# Patient Record
Sex: Male | Born: 1935 | Race: White | Hispanic: No | Marital: Married | Smoking: Former smoker
Health system: Southern US, Community
[De-identification: ages and names within clinical notes are randomized; demographics above are authoritative.]

## PROBLEM LIST (undated history)

## (undated) DIAGNOSIS — N400 Enlarged prostate without lower urinary tract symptoms: Secondary | ICD-10-CM

## (undated) DIAGNOSIS — I2510 Atherosclerotic heart disease of native coronary artery without angina pectoris: Secondary | ICD-10-CM

## (undated) DIAGNOSIS — G4730 Sleep apnea, unspecified: Secondary | ICD-10-CM

## (undated) DIAGNOSIS — M1990 Unspecified osteoarthritis, unspecified site: Secondary | ICD-10-CM

## (undated) DIAGNOSIS — E119 Type 2 diabetes mellitus without complications: Secondary | ICD-10-CM

## (undated) DIAGNOSIS — R2689 Other abnormalities of gait and mobility: Secondary | ICD-10-CM

## (undated) DIAGNOSIS — C801 Malignant (primary) neoplasm, unspecified: Secondary | ICD-10-CM

## (undated) DIAGNOSIS — N189 Chronic kidney disease, unspecified: Secondary | ICD-10-CM

## (undated) DIAGNOSIS — Z8719 Personal history of other diseases of the digestive system: Secondary | ICD-10-CM

## (undated) DIAGNOSIS — I739 Peripheral vascular disease, unspecified: Secondary | ICD-10-CM

## (undated) DIAGNOSIS — R0601 Orthopnea: Secondary | ICD-10-CM

## (undated) DIAGNOSIS — R12 Heartburn: Secondary | ICD-10-CM

## (undated) DIAGNOSIS — E785 Hyperlipidemia, unspecified: Secondary | ICD-10-CM

## (undated) DIAGNOSIS — I10 Essential (primary) hypertension: Secondary | ICD-10-CM

## (undated) DIAGNOSIS — R011 Cardiac murmur, unspecified: Secondary | ICD-10-CM

## (undated) DIAGNOSIS — G709 Myoneural disorder, unspecified: Secondary | ICD-10-CM

## (undated) HISTORY — PX: HERNIA REPAIR: SHX51

## (undated) HISTORY — PX: APPENDECTOMY: SHX54

## (undated) HISTORY — DX: Heartburn: R12

## (undated) HISTORY — DX: Cardiac murmur, unspecified: R01.1

## (undated) HISTORY — PX: TONSILLECTOMY: SUR1361

## (undated) HISTORY — DX: Hyperlipidemia, unspecified: E78.5

## (undated) HISTORY — DX: Unspecified osteoarthritis, unspecified site: M19.90

## (undated) HISTORY — DX: Sleep apnea, unspecified: G47.30

## (undated) HISTORY — PX: OTHER SURGICAL HISTORY: SHX169

## (undated) HISTORY — PX: ANKLE FRACTURE SURGERY: SHX122

## (undated) HISTORY — PX: LEG SURGERY: SHX1003

## (undated) HISTORY — PX: CHOLECYSTECTOMY: SHX55

---

## 2001-01-07 ENCOUNTER — Emergency Department (HOSPITAL_COMMUNITY): Admission: EM | Admit: 2001-01-07 | Discharge: 2001-01-07 | Payer: Self-pay | Admitting: Emergency Medicine

## 2003-09-17 ENCOUNTER — Other Ambulatory Visit: Payer: Self-pay

## 2004-08-17 ENCOUNTER — Ambulatory Visit: Payer: Self-pay | Admitting: Internal Medicine

## 2004-08-18 ENCOUNTER — Ambulatory Visit: Payer: Self-pay | Admitting: Internal Medicine

## 2006-06-21 ENCOUNTER — Ambulatory Visit: Payer: Self-pay | Admitting: Family Medicine

## 2006-09-08 ENCOUNTER — Ambulatory Visit: Payer: Self-pay | Admitting: Family Medicine

## 2006-10-27 ENCOUNTER — Ambulatory Visit: Payer: Self-pay | Admitting: Podiatry

## 2007-06-09 ENCOUNTER — Ambulatory Visit: Payer: Self-pay | Admitting: Gastroenterology

## 2007-07-27 ENCOUNTER — Ambulatory Visit: Payer: Self-pay | Admitting: Gastroenterology

## 2007-08-15 ENCOUNTER — Ambulatory Visit: Payer: Self-pay | Admitting: Surgery

## 2007-08-17 ENCOUNTER — Inpatient Hospital Stay: Payer: Self-pay | Admitting: Surgery

## 2008-08-14 ENCOUNTER — Ambulatory Visit: Payer: Self-pay | Admitting: Unknown Physician Specialty

## 2008-11-13 ENCOUNTER — Emergency Department: Payer: Self-pay | Admitting: Emergency Medicine

## 2008-12-09 ENCOUNTER — Ambulatory Visit: Payer: Self-pay | Admitting: Surgery

## 2009-11-03 ENCOUNTER — Ambulatory Visit: Payer: Self-pay | Admitting: Unknown Physician Specialty

## 2010-07-20 ENCOUNTER — Encounter: Payer: Self-pay | Admitting: Gastroenterology

## 2010-08-06 ENCOUNTER — Encounter: Payer: Self-pay | Admitting: Gastroenterology

## 2010-08-06 ENCOUNTER — Ambulatory Visit: Payer: Self-pay | Admitting: Gastroenterology

## 2010-08-07 ENCOUNTER — Encounter: Payer: Self-pay | Admitting: Gastroenterology

## 2010-08-10 ENCOUNTER — Encounter: Payer: Self-pay | Admitting: Gastroenterology

## 2010-08-18 ENCOUNTER — Ambulatory Visit: Payer: Self-pay | Admitting: Surgery

## 2010-08-18 ENCOUNTER — Encounter: Payer: Self-pay | Admitting: Gastroenterology

## 2010-09-04 ENCOUNTER — Encounter: Payer: Self-pay | Admitting: Gastroenterology

## 2010-10-01 ENCOUNTER — Telehealth (INDEPENDENT_AMBULATORY_CARE_PROVIDER_SITE_OTHER): Payer: Self-pay | Admitting: *Deleted

## 2010-10-01 ENCOUNTER — Encounter: Payer: Self-pay | Admitting: Gastroenterology

## 2010-10-01 ENCOUNTER — Encounter (INDEPENDENT_AMBULATORY_CARE_PROVIDER_SITE_OTHER): Payer: Self-pay | Admitting: *Deleted

## 2010-10-01 DIAGNOSIS — R933 Abnormal findings on diagnostic imaging of other parts of digestive tract: Secondary | ICD-10-CM | POA: Insufficient documentation

## 2010-10-08 ENCOUNTER — Ambulatory Visit (HOSPITAL_COMMUNITY)
Admission: RE | Admit: 2010-10-08 | Discharge: 2010-10-08 | Payer: Self-pay | Source: Home / Self Care | Attending: Gastroenterology | Admitting: Gastroenterology

## 2010-10-08 ENCOUNTER — Encounter: Payer: Self-pay | Admitting: Gastroenterology

## 2010-10-12 LAB — GLUCOSE, CAPILLARY: Glucose-Capillary: 101 mg/dL — ABNORMAL HIGH (ref 70–99)

## 2010-10-15 ENCOUNTER — Ambulatory Visit: Payer: Self-pay | Admitting: Gastroenterology

## 2010-10-15 ENCOUNTER — Encounter: Payer: Self-pay | Admitting: Gastroenterology

## 2010-10-19 ENCOUNTER — Ambulatory Visit: Payer: Self-pay | Admitting: Surgery

## 2010-10-20 ENCOUNTER — Inpatient Hospital Stay: Payer: Self-pay | Admitting: Surgery

## 2010-10-22 LAB — PATHOLOGY REPORT

## 2010-10-29 ENCOUNTER — Ambulatory Visit: Payer: Self-pay | Admitting: Surgery

## 2010-10-29 NOTE — Progress Notes (Signed)
Summary: EUS  Phone Note Outgoing Call   Call placed by: Chales Abrahams CMA Duncan Dull),  October 01, 2010 4:10 PM Summary of Call: Phone Note  Outgoing Call   Call placed by: Chales Abrahams CMA Duncan Dull),  October 01, 2010 9:29 AM Summary of Call: pt scheduled for EUS need to review meds and instruct pt, need to send anti coag letter and find out primary MD.  Pt also needs to bring a copy of the CT on disc to the appt. Initial call taken by: Chales Abrahams CMA Duncan Dull),  October 01, 2010 9:30 AM  Follow-up for Phone Call         April at Dr Nigel Bridgeman office is aware of appt and she gave me the name of the PCP managing his plavix.  I will send a letter.  left message on pt's  machine to call back regarding appt. Follow-up by: Chales Abrahams CMA (AAMA),  October 01, 2010 9:44 AM  New Problems: NONSPECIFIC ABN FINDING RAD & OTH EXAM GI TRACT (ICD-793.4)   New Problems: NONSPECIFIC ABN FINDING RAD & OTH EXAM GI TRACT (ICD-793.4) Initial call taken by: Chales Abrahams CMA Duncan Dull),  October 01, 2010 4:10 PM  Follow-up for Phone Call        spoke with the pt and he is aware of the instructions  meds reviewed.  I have placed a call to Dr Burnett Sheng regarding his plavix and I will call him tomorrow with any further instructions regarding holding plavix for the procedrue Follow-up by: Chales Abrahams CMA Duncan Dull),  October 01, 2010 4:33 PM  New Problems: NONSPECIFIC ABN FINDING RAD & OTH EXAM GI TRACT (ICD-793.4)   New Problems: NONSPECIFIC ABN FINDING RAD & OTH EXAM GI TRACT (ICD-793.4)

## 2010-10-29 NOTE — Letter (Signed)
Summary: Diabetic Instructions  [facility]  [address] [place], [place] [address]   Phone: [phone]  Fax: [phone]    Justin Hancock 02-14-1927 MRN: [id number]   X   ORAL DIABETIC MEDICATION INSTRUCTIONS  The day before your procedure:   Take your diabetic pill as you do normally  The day of your procedure:   Do not take your diabetic pill    We will check your blood sugar levels during the admission process and again in Recovery before discharging you home  ________________________________________________________________________

## 2010-10-29 NOTE — Letter (Signed)
Summary: [facility]   Imported By: Sherian Rein 10/12/2010 12:40:42  _____________________________________________________________________  External Attachment:    Type:   Image     Comment:   External Document

## 2010-10-29 NOTE — Letter (Signed)
Summary: [facility]   Imported By: Sherian Rein 10/12/2010 12:39:38  _____________________________________________________________________  External Attachment:    Type:   Image     Comment:   External Document

## 2010-10-29 NOTE — Procedures (Signed)
Summary: Endoscopic Ultrasound  Patient: [name] Note: All result statuses are Final unless otherwise noted.  Tests: (1) Endoscopic Ultrasound (EUS)  EUS Endoscopic Ultrasound                             DONE     [facility]     [address] [place], [place]  [address]           ENDOSCOPIC ULTRASOUND PROCEDURE REPORT           PATIENT:  Justin Hancock, Justin Hancock  MR#:  [id number]     BIRTHDATE:  1926-11-13  GENDER:  male     ENDOSCOPIST:  Rachael Fee, MD     PROCEDURE DATE:  10/08/2010     REFERRED:  Dr. Lutricia Feil, at [facility]     PROCEDURE:  Upper EUS     ASA CLASS:  Class II     INDICATIONS:  intermittent right groin pains/n/v/dark urine;     recent imaging showing dilated CBD (LFTs normal), calcifications     in pancreas, cysts in pancreas     MEDICATIONS:   Fentanyl 75 mcg IV, Versed 6 mg IV           DESCRIPTION OF PROCEDURE:   After the risks benefits and     alternatives of the procedure were  explained, informed consent     was obtained. The patient was then placed in the left, lateral,     decubitus postion and IV sedation was administered. Throughout the     procedure, the patient's blood pressure, pulse and oxygen     saturations were monitored continuously.  Under direct     visualization, the Pentax Radial EUS [id number] and egd [id number]     endoscope was introduced through the mouth and advanced to the     second portion of the duodenum.  Water was used as necessary to     provide an acoustic interface.  Upon completion of the imaging,     water was removed and the patient was sent to the recovery room in     satisfactory condition.     <<PROCEDUREIMAGES>>     Endoscopic findings:     1. Normal esophagus     2. Normal stomach     3. Duodenal edema, slight narrowing in distal duodenal bulb (able     to pass EUS scope and standard adult gastroscope).  No clearly     adenomatous mucosa.           EUS findings:     1. CBD was dilated (up to 9-21mm)  and contained at least 2     hyperechoic, shadowing, mobile stones.     2. Main pancreatic duct was normal.     3. Pancreatic parenchyma contained several small (5-16mm) cysts and     also severeal small punctate calcifications.  There were no clear     solid masses. No other signs of chronic pancreatitis (no     honeycombing of parenhyma).     4. No peripancreatic adenopathy.     5. Limited views of liver, spleen, portal and splenic vessels were     all normal           Impression:     Choledocholithiasis.  Also several scattered calcifications and     cysts throughout pancreas but no suspicous solid masses. The  distal duodenal bulb was edematous, slightly narrowed but not     malignant appearing.  His symptoms are in RLQ, but he clearly has     CBD stones that need removal with ERCP and also should have     cholecystectomy.  With duodenal bulb edema and only pain is in     RLQ, perhaps he has biliary-enteric communication and is stones     are intermittently lodging at IC valve.           ______________________________     Rachael Fee, MD           n.     eSIGNED:   Rachael Fee at 10/08/2010 09:40 AM           Justin Hancock, [id number]  Note: An exclamation mark (!) indicates a result that was not dispersed into the flowsheet. Document Creation Date: 10/08/2010 9:41 AM _______________________________________________________________________  (1) Order result status: Final Collection or observation date-time: 10/08/2010 09:28 Requested date-time:  Receipt date-time:  Reported date-time:  Referring Physician:   Ordering Physician: Rob Bunting [phone]) Specimen Source:  Source: Launa Grill Order Number: [id number] Lab site:

## 2010-10-29 NOTE — Letter (Signed)
Summary: EGD Instructions  [facility]  [address] [place], [place] [address]   Phone: [phone]  Fax: [phone]       Justin Hancock    06-Oct-1926    MRN: [id number]       Procedure Day /Date:10/08/10 [name]       Arrival Time: 730 am     Procedure Time:830 am     Location of Procedure:                     [facility] ( Outpatient Registration)    PREPARATION FOR ENDOSCOPY   On 10/08/10  THE DAY OF THE PROCEDURE:  1.   No solid foods, milk or milk products are allowed after midnight the night before your procedure.  2.   Do not drink anything colored red or purple.  Avoid juices with pulp.  No orange juice.  3.  You may drink clear liquids until 430 am , which is 4  hours before your procedure.                                                                                                CLEAR LIQUIDS INCLUDE: Water Jello Ice Popsicles Tea (sugar ok, no milk/cream) Powdered fruit flavored drinks Coffee (sugar ok, no milk/cream) Gatorade Juice: apple, white grape, white cranberry  Lemonade Clear bullion, consomm, broth Carbonated beverages (any kind) Strained chicken noodle soup Hard Candy   MEDICATION INSTRUCTIONS  Unless otherwise instructed, you should take regular prescription medications with a small sip of water as early as possible the morning of your procedure.  Diabetic patients - see separate instructions.  Stop taking Plavix 10/03/10  (5 days before procedure).       Additional medication instructions: Please bring a copy of the CT scan on disc to the appointment.             OTHER INSTRUCTIONS  You will need a responsible adult at least 75 years of age to accompany you and drive you home.   This person must remain in the waiting room during your procedure.  Wear loose fitting clothing that is easily removed.  Leave jewelry and other valuables at home.  However, you may wish to bring a book to read or an  iPod/MP3 player to listen to music as you wait for your procedure to start.  Remove all body piercing jewelry and leave at home.  Total time from sign-in until discharge is approximately 2-3 hours.  You should go home directly after your procedure and rest.  You can resume normal activities the day after your procedure.  The day of your procedure you should not:   Drive   Make legal decisions   Operate machinery   Drink alcohol   Return to work  You will receive specific instructions about eating, activities and medications before you leave.    The above instructions have been reviewed and explained to me by   Chales Abrahams CMA (AAMA)  October 01, 2010 4:21 PM     I fully understand and can  verbalize these instructions over the phone mailed to the home Date 10/01/10

## 2010-10-29 NOTE — Letter (Signed)
Summary: Anticoagulation/[facility]  Anticoagulation/[facility]   Imported By: Sherian Rein 10/08/2010 11:27:50  _____________________________________________________________________  External Attachment:    Type:   Image     Comment:   External Document

## 2010-10-29 NOTE — Letter (Signed)
Summary: Anticoagulation Modification Letter  [facility]  [address] [place], [place] [address]   Phone: [phone]  Fax: [phone]    October 01, 2010  Re:    Justin Hancock DOB:    [birth year] MRN:    [id number]    Dear Dr Pearson Forster  We have scheduled the above patient for an endoscopic procedure. Our records show that  he/she is on anticoagulation therapy. Please advise as to how long the patient may come off their therapy of plavix prior to the scheduled procedure(s) on 10/08/10   Please fax back/or route the completed form to Patty at [phone]  Thank you for your help with this matter.  Sincerely,  Chales Abrahams CMA Duncan Dull)   Physician Recommendation:  Hold Plavix 5 days prior ________________  Please respond ASAP pt needs to hold from 10/03/10     Appended Document: Anticoagulation Modification Letter response recieved ok to hold plavix 5 days  letter to be scanned to EMR  Appended Document: Anticoagulation Modification Letter pt aware

## 2010-11-04 NOTE — Procedures (Signed)
Summary: ERCP / [facility]  ERCP / [facility]   Imported By: Lennie Odor 10/29/2010 14:00:39  _____________________________________________________________________  External Attachment:    Type:   Image     Comment:   External Document

## 2010-11-12 NOTE — Procedures (Signed)
[facility]   Imported By: Lennie Odor 11/06/2010 10:11:04  _____________________________________________________________________  External Attachment:    Type:   Image     Comment:   External Document

## 2010-12-02 ENCOUNTER — Inpatient Hospital Stay: Payer: Self-pay | Admitting: Surgery

## 2010-12-15 ENCOUNTER — Ambulatory Visit: Payer: Self-pay | Admitting: Surgery

## 2010-12-30 ENCOUNTER — Ambulatory Visit: Payer: Self-pay | Admitting: Surgery

## 2011-01-20 ENCOUNTER — Ambulatory Visit: Payer: Self-pay | Admitting: Surgery

## 2011-11-28 IMAGING — CR DG CHOLANGIOGRAM OPERATIVE
2 series · 2 of 2 positions shown · non-contrast
Comparison: none

REASON FOR EXAM: cholelithiasis
COMMENTS:

PROCEDURE:     DXR - DXR CHOLANGIOGRAM OP (INITIAL)  - October 20, 2010  [DATE]
RESULT:     Intraoperative cholangiogram reveals distention of the common
and intrahepatic ducts with a large filling defect in the common bile duct.
This is most consistent with choledocholithiasis.

[chole 1]
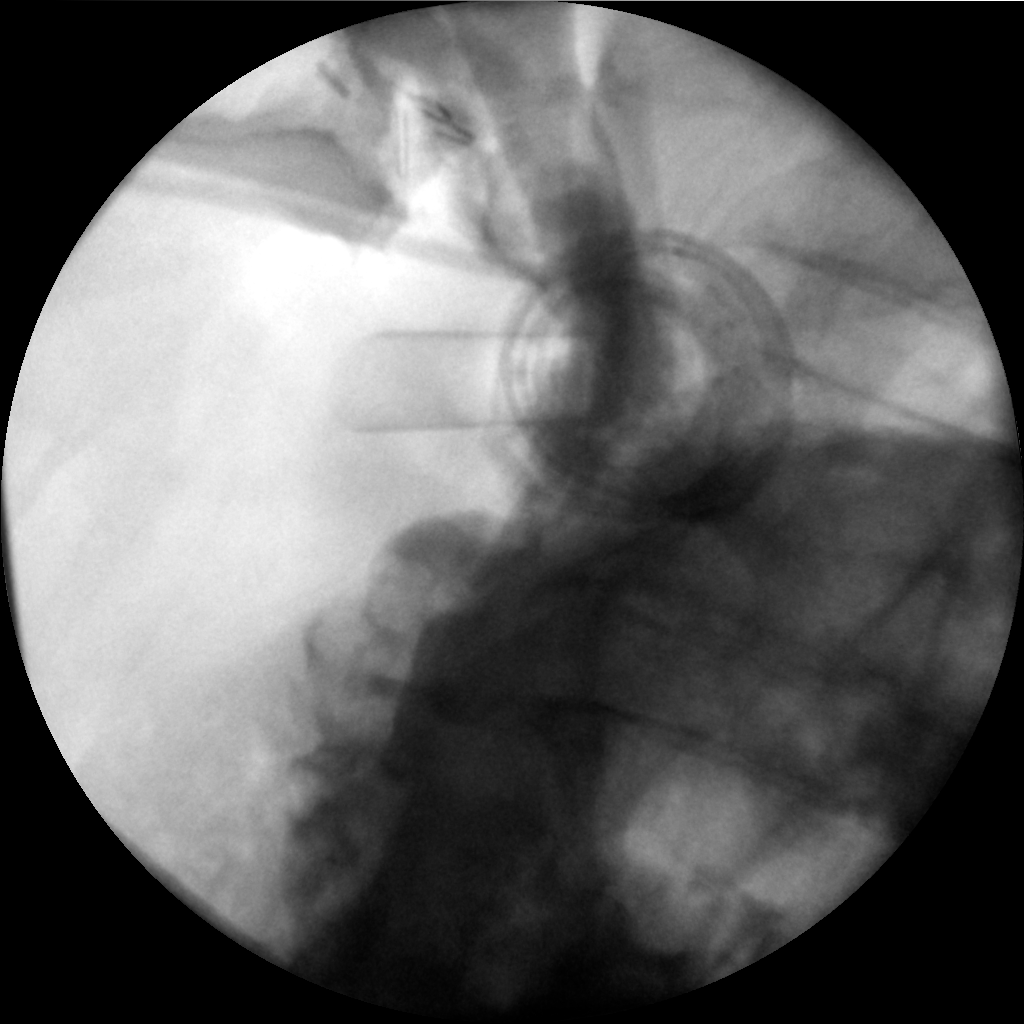

[[id]]
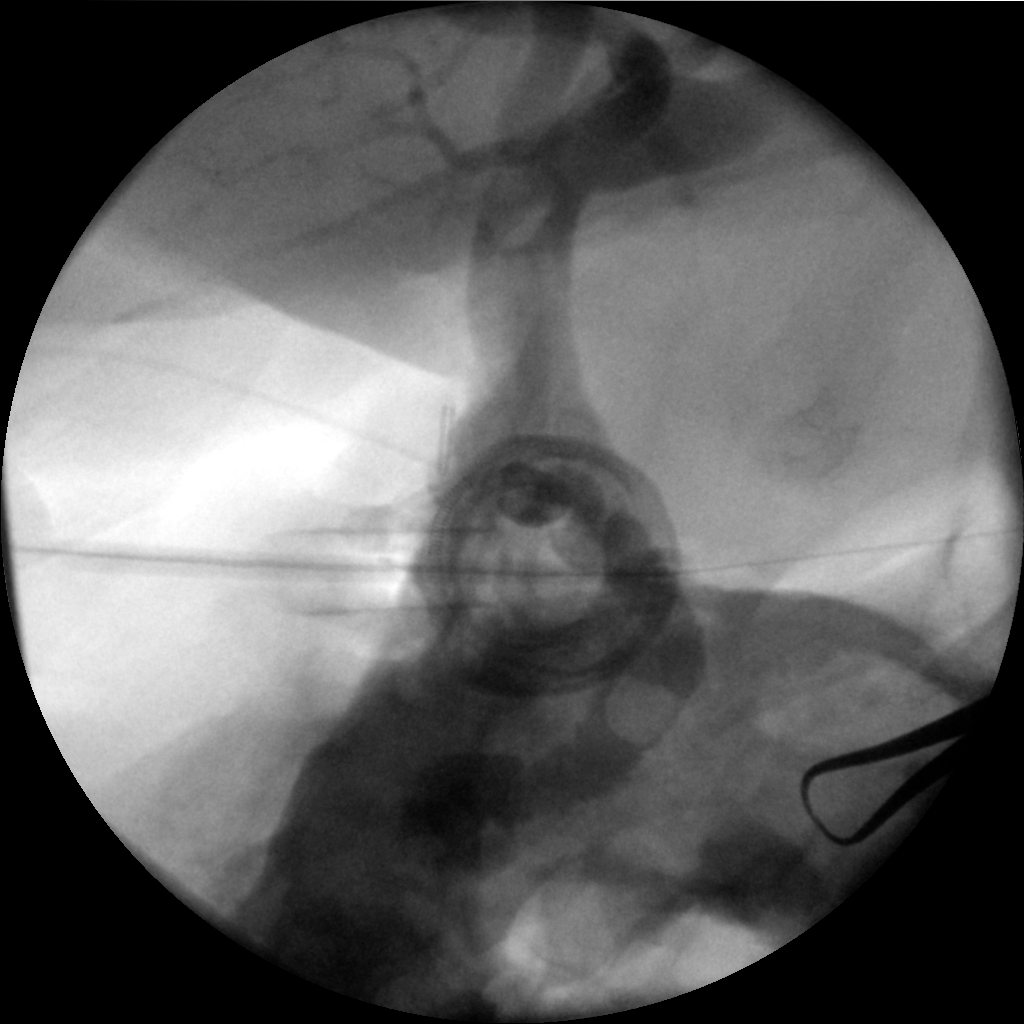

[2 of 2 positions shown; findings below may reference images not displayed]

IMPRESSION: Large filling defect in the common bile duct with prominent biliary
distention.

## 2011-12-07 IMAGING — RF DG CHOLANGIOGRAM OPERATIVE
1 series · 11 of 11 positions shown · non-contrast
Comparison: none

REASON FOR EXAM: status post cholecystectomy t tube in  now  run dye into
tube
COMMENTS:

[Series 1: run · 11 of 11 slices shown]
[im 1/11]
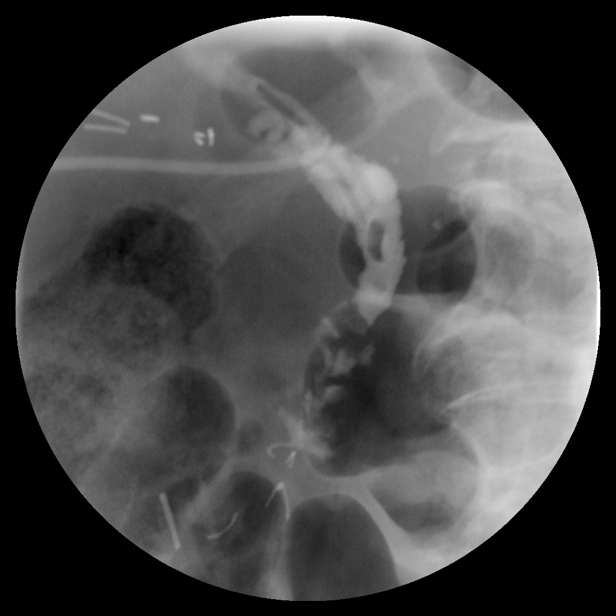
[im 2/11]
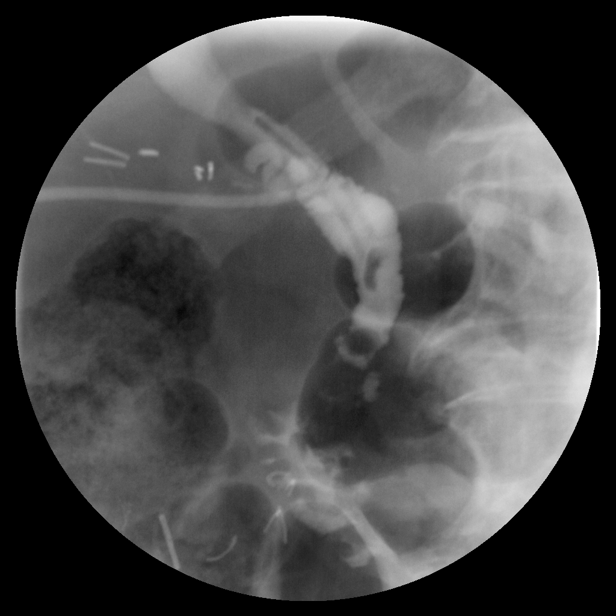
[im 3/11]
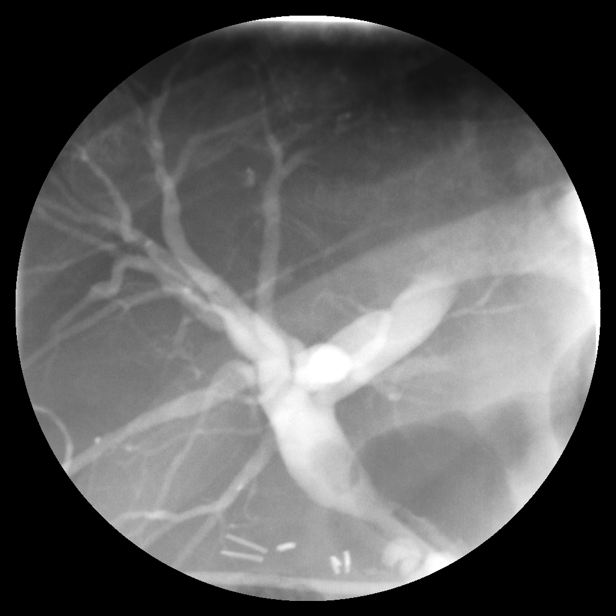
[im 4/11]
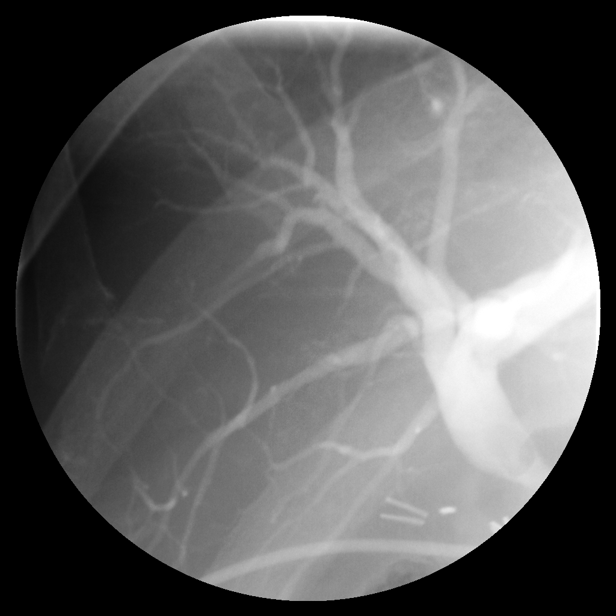
[im 5/11]
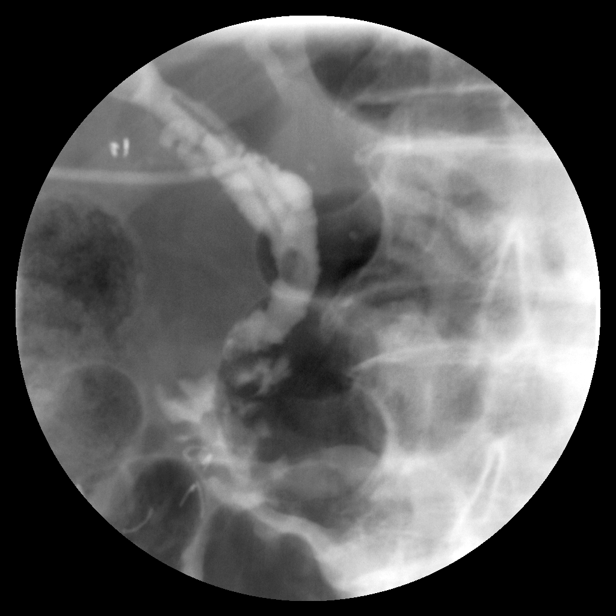
[im 6/11]
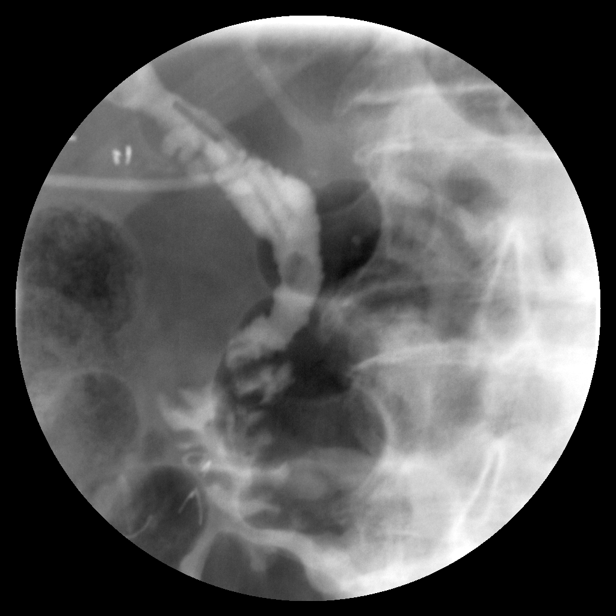
[im 7/11]
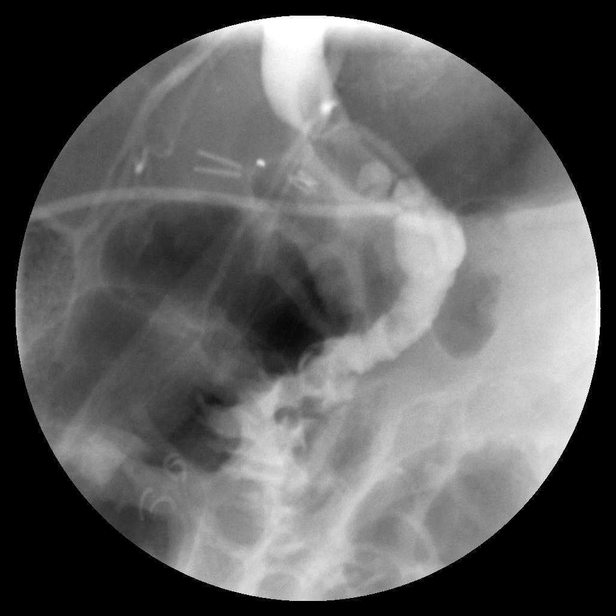
[im 8/11]
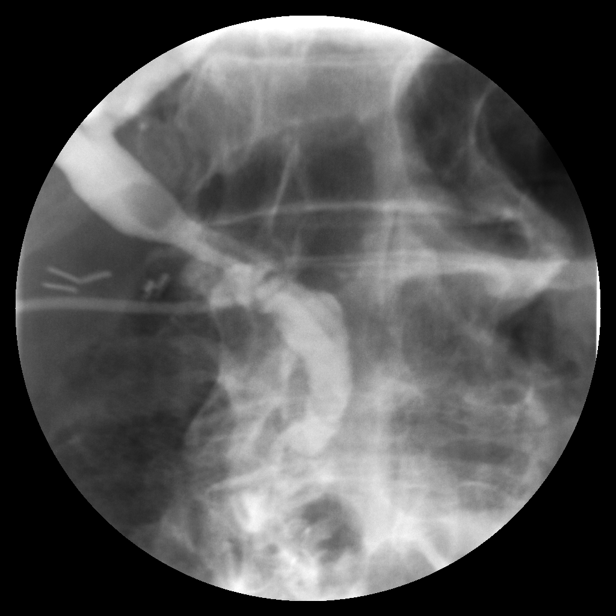
[im 9/11]
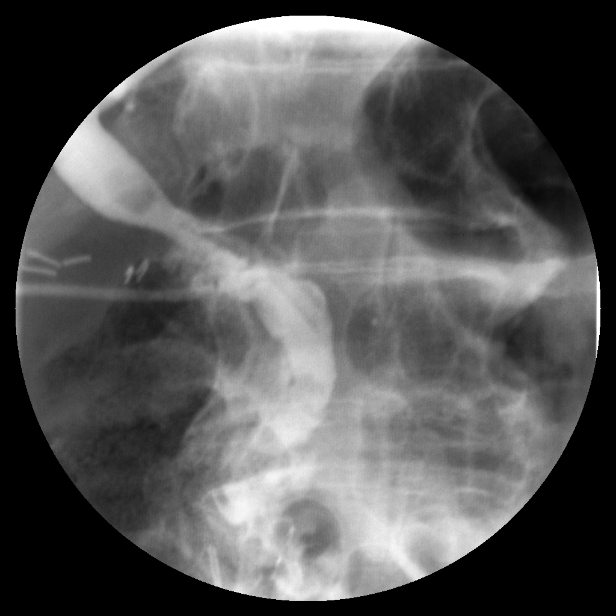
[im 10/11]
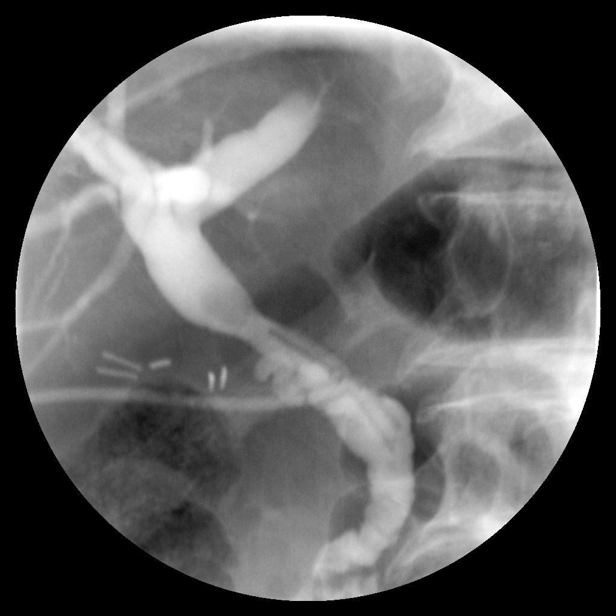
[im 11/11]
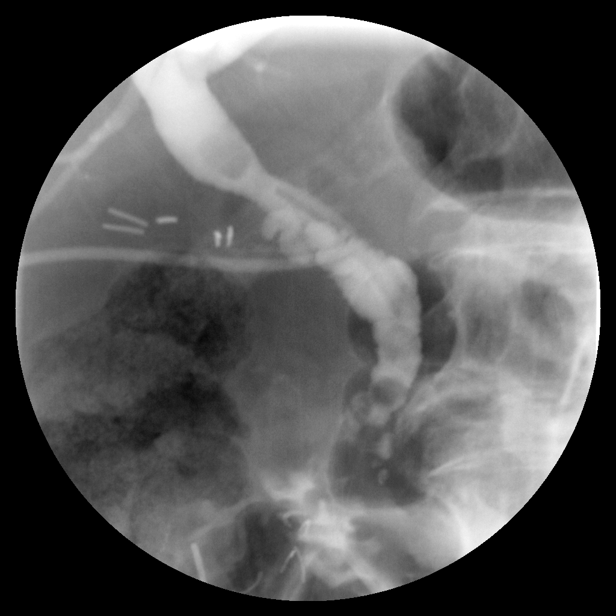

[11 of 11 positions shown; findings below may reference images not displayed]

PROCEDURE:     FL  - FL CHOLANGIOGRAM POST-OP T-TUBE  - October 29, 2010 [DATE]

RESULT:     Following sterile preparation of the patient's tube a 23-gauge
butterfly needle was advanced into the clamp tube and dilute non-ionic
contrast was administered. Two large mobile filling defects were noted in
the biliary system, one in the common bile duct and one in the common
hepatic duct. These are consistent with very large retained common bile duct
stones. The intrahepatic ducts were distended with contrast. The left duct
could not be completely evaluated and a stone in the left duct cannot be
completely excluded. The more likely etiology is inability to fill the left
duct with contrast.
IMPRESSION: 1. Two large retained stones. One is in the common bile duct, the other in
the common hepatic duct. There was easy flow of contrast into the duodenum
without evidence of ampullary or common bile duct obstruction.

## 2012-01-10 IMAGING — XA XA PELV EXAM
14 of 24 series · 14 of 24 positions shown · non-contrast
Comparison: none

REASON FOR EXAM: COMMENTS:

[Series 1: single · 1 of 1 slices shown (1 of 14)]
[im 1/1]
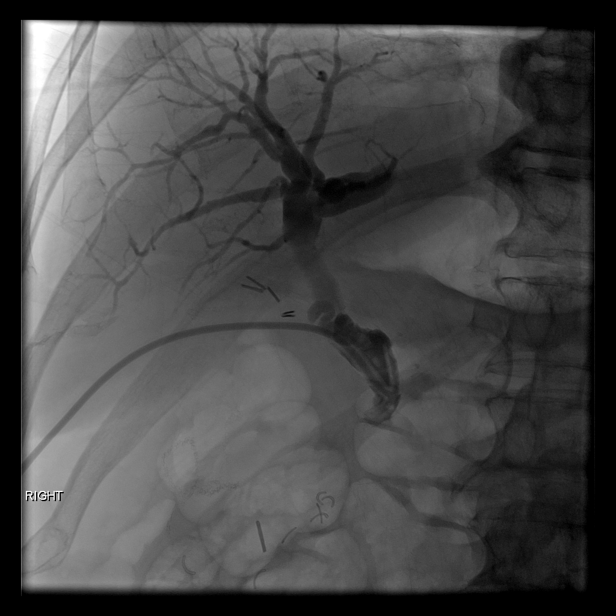

[Series 3: single · 1 of 1 slices shown (2 of 14)]
[im 1/1]
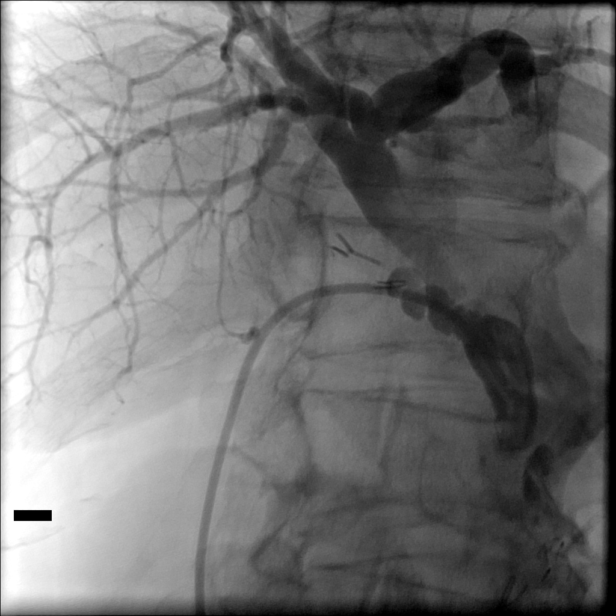

[Series 5: single · 1 of 1 slices shown (3 of 14)]
[im 1/1]
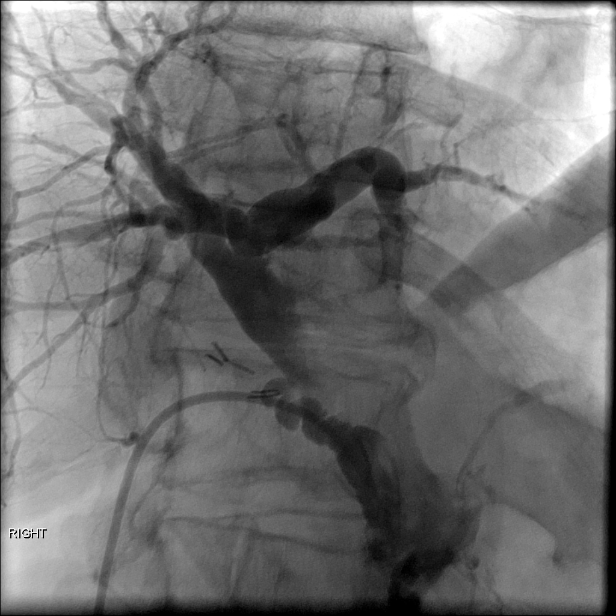

[Series 7: single · 1 of 1 slices shown (4 of 14)]
[im 1/1]
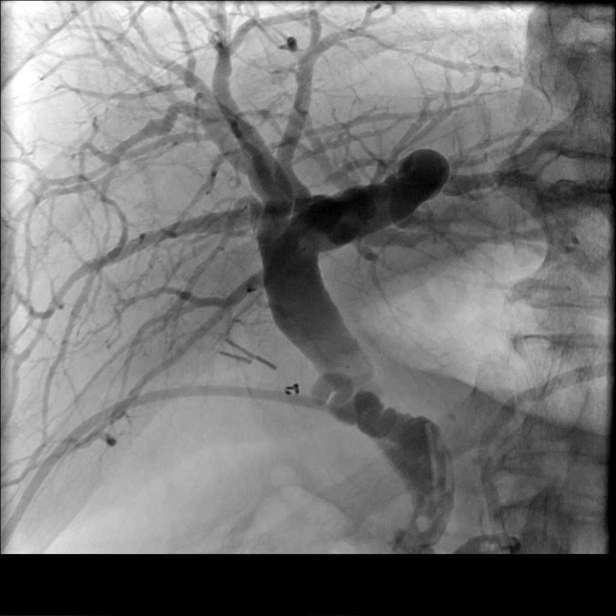

[Series 8: single · 1 of 1 slices shown (5 of 14)]
[im 1/1]
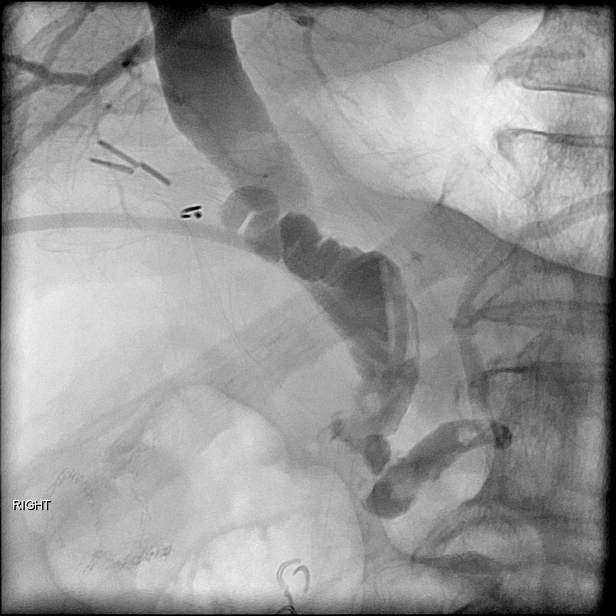

[Series 10: single · 1 of 1 slices shown (6 of 14)]
[im 1/1]
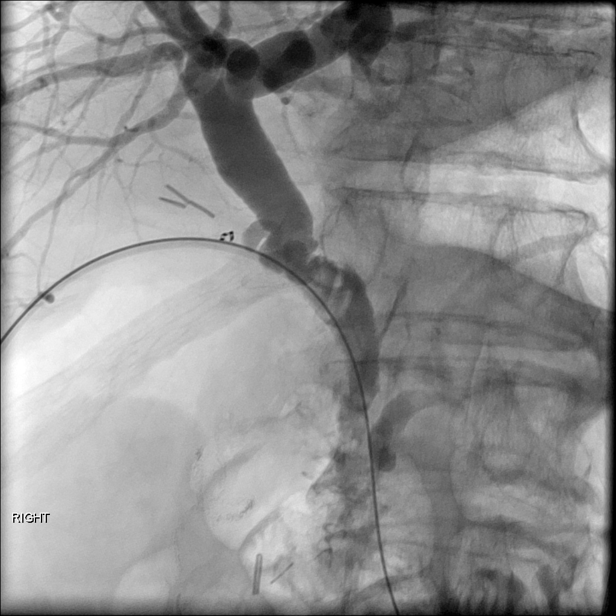

[Series 12: single · 1 of 1 slices shown (7 of 14)]
[im 1/1]
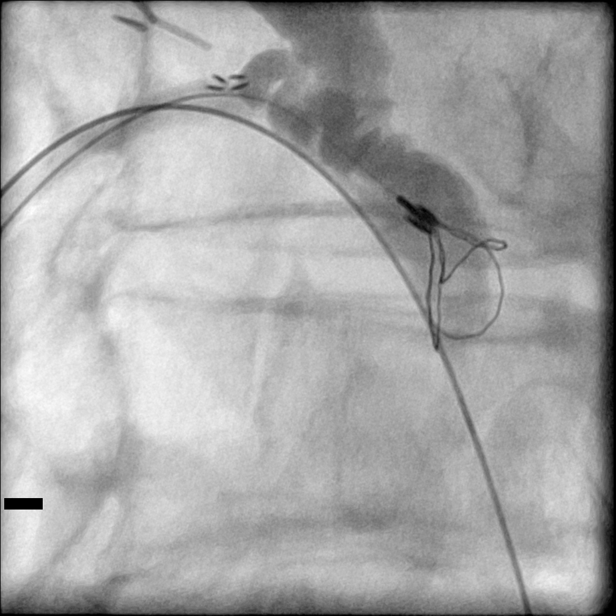

[Series 13: single · 1 of 1 slices shown (8 of 14)]
[im 1/1]
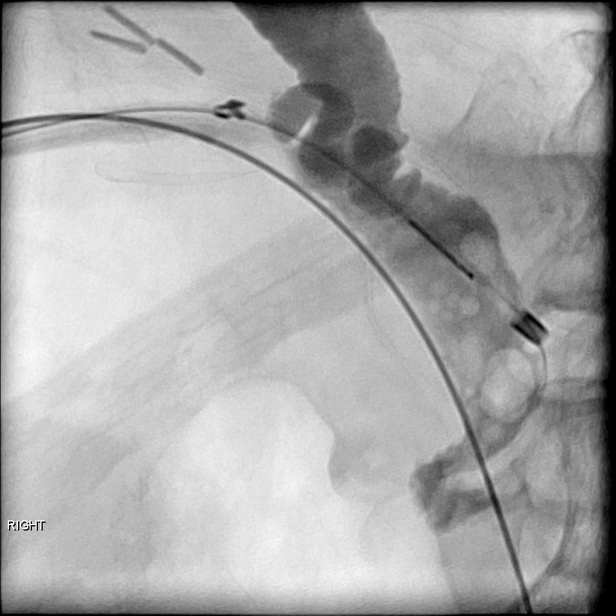

[Series 15: single · 1 of 1 slices shown (9 of 14)]
[im 1/1]
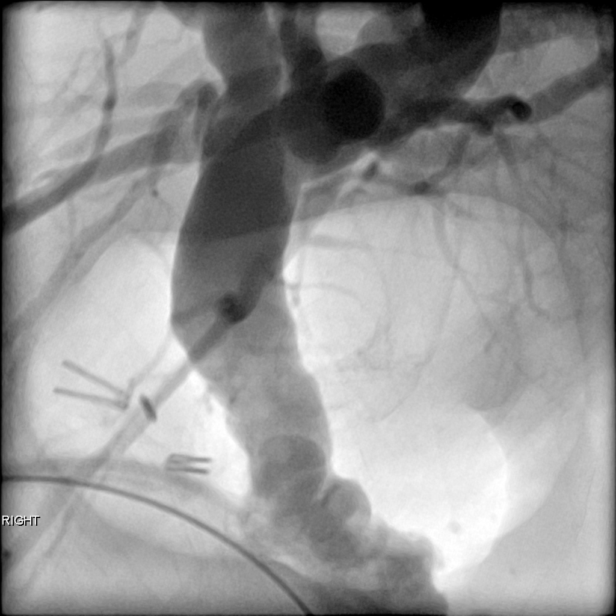

[Series 17: single · 1 of 1 slices shown (10 of 14)]
[im 1/1]
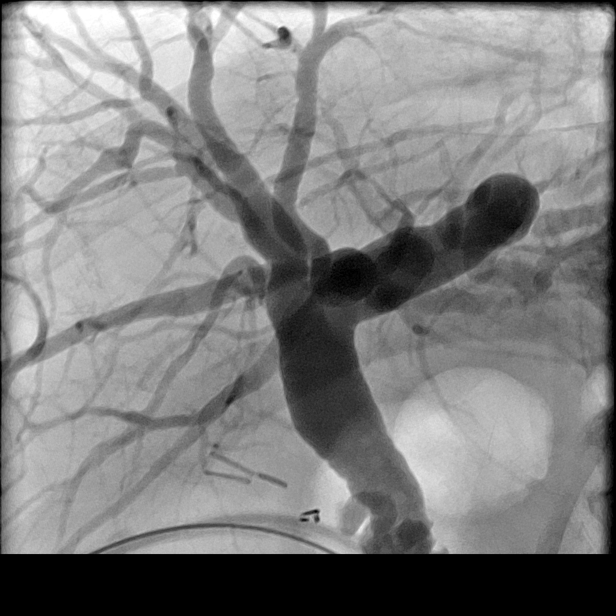

[Series 19: single · 1 of 1 slices shown (11 of 14)]
[im 1/1]
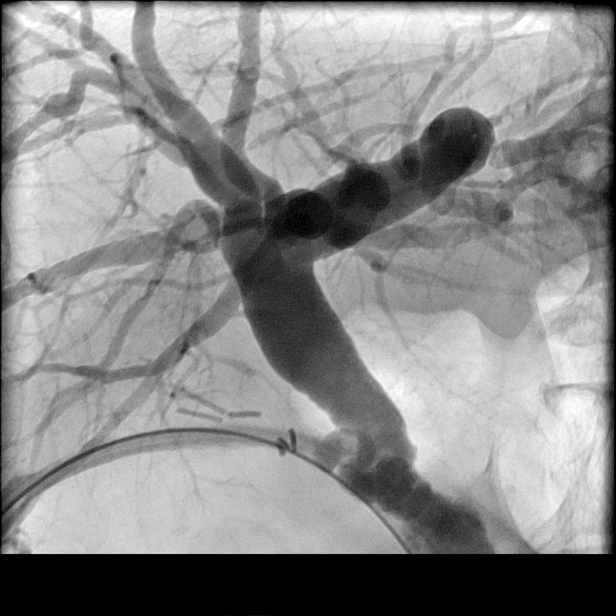

[Series 20: single · 1 of 1 slices shown (12 of 14)]
[im 1/1]
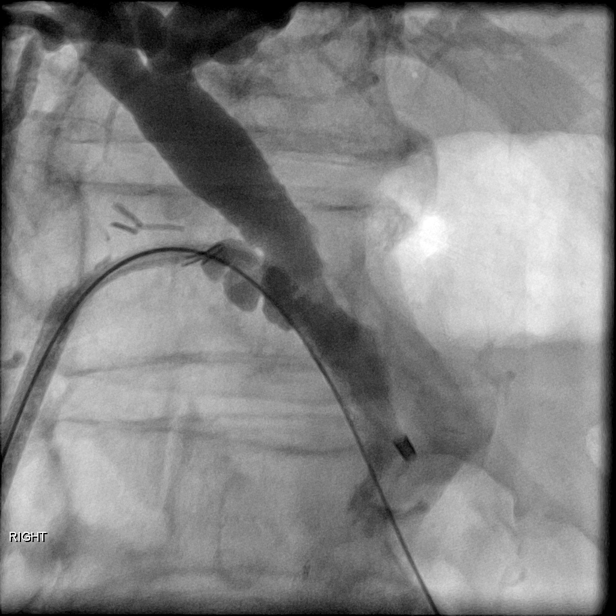

[Series 22: single · 1 of 1 slices shown (13 of 14)]
[im 1/1]
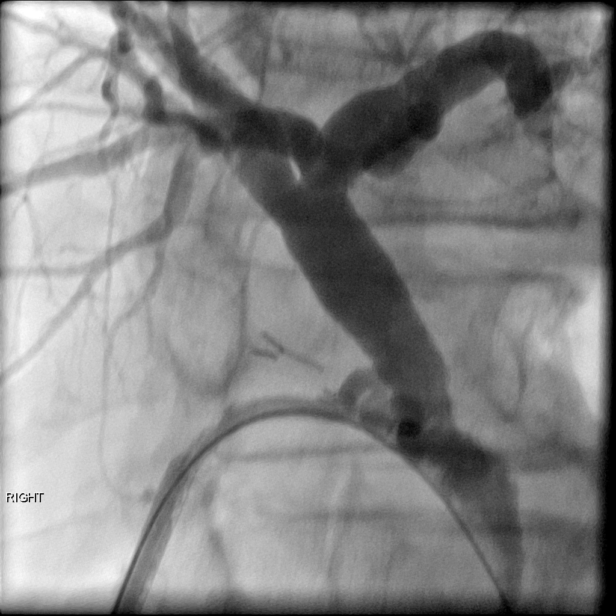

[Series 24: single · 1 of 1 slices shown (14 of 14)]
[im 1/1]
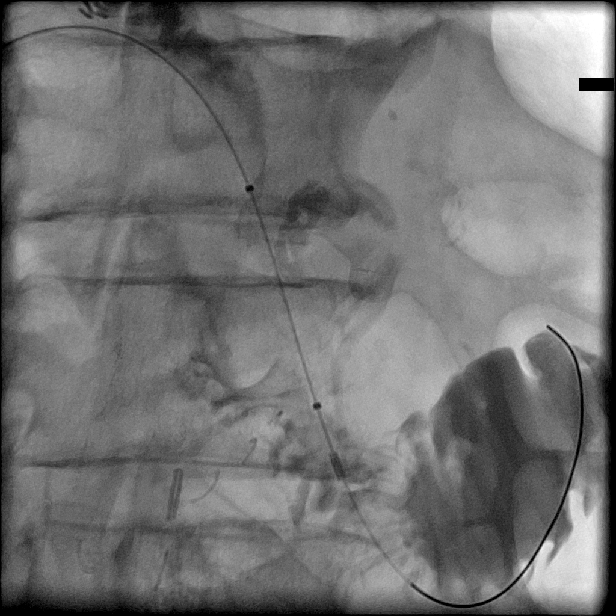

[14 of 24 positions shown; findings below may reference images not displayed]

PROCEDURE:     VAS - BILIARY STONE EXT PERC  - December 02, 2010 [DATE]

RESULT:      History: Retained biliary ductal stones.

Procedure and Findings: After discussing the risk and benefits of the
procedure with the patient informed consent was obtained. Colonic
cholangiogram performed through the existing T tube. What appears to
represent to retained stones noted. One in the common hepatic duct,  the
other in the common bile duct. Two stones also noted in the pancreatic duct.
 Intrahepatic ducts are unremarkable. The T-tube was pulled over an Amplatz
extra-stiff wire following placement of the extra-stiff guidewire into the a
duodenum. A a 14 French peel-away sheath was placed into the T-tube tract
and the Misael wire basket the snare was placed into the biliary system and
the 2 stones retrieved. This was followed by placement of a 6 mm 4 cm
Conquest balloon across the ampulla of Vater and a sphincteroplasty was
performed. A this was followed by placement of a 12 French biliary drainage
catheter with sideholes placed in the common bile duct and the duodenum.
This catheter was capped. No immediate complications.
IMPRESSION: Successful biliary ductal stone removals.

Addendum: It should be noted that pancreatic duct stones are present.

## 2013-04-26 ENCOUNTER — Ambulatory Visit: Payer: Self-pay | Admitting: Gastroenterology

## 2013-04-27 LAB — PATHOLOGY REPORT

## 2013-05-24 ENCOUNTER — Ambulatory Visit: Payer: Self-pay | Admitting: Internal Medicine

## 2013-07-02 ENCOUNTER — Ambulatory Visit: Payer: Self-pay | Admitting: Vascular Surgery

## 2013-07-02 LAB — BASIC METABOLIC PANEL
Anion Gap: 5 — ABNORMAL LOW (ref 7–16)
BUN: 27 mg/dL — ABNORMAL HIGH (ref 7–18)
Calcium, Total: 8.6 mg/dL (ref 8.5–10.1)
Chloride: 109 mmol/L — ABNORMAL HIGH (ref 98–107)
Co2: 26 mmol/L (ref 21–32)
Creatinine: 1.47 mg/dL — ABNORMAL HIGH (ref 0.60–1.30)
EGFR (African American): 50 — ABNORMAL LOW
EGFR (Non-African Amer.): 43 — ABNORMAL LOW
Glucose: 115 mg/dL — ABNORMAL HIGH (ref 65–99)
Osmolality: 285 (ref 275–301)
Potassium: 4.3 mmol/L (ref 3.5–5.1)
Sodium: 140 mmol/L (ref 136–145)

## 2013-09-27 DIAGNOSIS — N189 Chronic kidney disease, unspecified: Secondary | ICD-10-CM

## 2013-09-27 HISTORY — DX: Chronic kidney disease, unspecified: N18.9

## 2014-05-16 DIAGNOSIS — R197 Diarrhea, unspecified: Secondary | ICD-10-CM | POA: Insufficient documentation

## 2014-05-19 ENCOUNTER — Other Ambulatory Visit: Payer: Self-pay | Admitting: Unknown Physician Specialty

## 2014-05-19 LAB — CLOSTRIDIUM DIFFICILE(ARMC)

## 2014-05-21 LAB — STOOL CULTURE

## 2014-08-01 ENCOUNTER — Ambulatory Visit: Payer: Self-pay | Admitting: Internal Medicine

## 2015-01-17 NOTE — Consult Note (Signed)
CHIEF COMPLAINT and HISTORY:  Subjective/Chief Complaint cardiac disease   History of Present Illness I am asked to see patient regarding incidental finding of renal artery stenosis on his cardiac catheterization today.  He has 7 coronary stents placed previously. He has hypertension and this is not well controlled.  His BP today after the procedure is in the 190/80 range while I am seeing him.   I have reviewed his images and the left renal artery stenosis is the 80-85% range.  The right renal artery stenosis is <30%.   PAST MEDICAL/SURGICAL HISTORY:  Past Medical History:   CAD:    BPH - Benign Prostatic Hypertrophy:    Obesity:    Deafness Left Ear:    Bells Palsy:    Hypertension:    Heart Murmer:    Arthritis:    Diabetes:    Colon Cancer:    Sleep Apnea:    Chronic Cough:    Hemicolectomy:    Hernia:    Cyst:    Colectomy: 1974   Back Surgery:    Tonsillectomy:    Cardiac Stents:   ALLERGIES:  Allergies:  Penicillin: Hives, Fever  HOME MEDICATIONS:  Home Medications: Medication Instructions Status  aspirin 81 mg oral tablet 1 tab(s) PO once a day (at bedtime)  Active  lisinopril 20 mg oral tablet 1 tab(s) PO 2 times a day  Active  lovastatin 20 mg oral tablet 1 tab(s) PO 2 times a day  Active  Vitamin B Complex 1 tab(s) PO once a day (at bedtime)  Active  Vitamin C  500mg  1 tab(s) PO once a day (at bedtime)  Active  Plavix 75 mg oral tablet 1 tab(s) orally once a day (in the morning)  Active  metoprolol 25 mg oral tablet, extended release 1 tab(s) orally 2 times a day  Active  metformin 500 mg oral tablet 1 tab(s) orally 4 times a day  Active  Fish Oil 1200mg  1   once a day  Active  Flomax 0.4 mg oral capsule 1  orally once a day PM Active  multivitamin 1   once a day  Active   Family and Social History:  Family History Non-Contributory   Social History positive tobacco (Greater than 1 year), negative ETOH   Place of Living Home    Review of Systems:  Fever/Chills No   Cough No   Sputum No   Abdominal Pain No   Diarrhea No   Constipation No   Nausea/Vomiting No   SOB/DOE No   Chest Pain Yes   Dysuria No   Tolerating PT Yes   Tolerating Diet Yes   Medications/Allergies Reviewed Medications/Allergies reviewed   Physical Exam:  GEN well developed, well nourished   HEENT pink conjunctivae, moist oral mucosa   NECK No masses  trachea midline   RESP normal resp effort  no use of accessory muscles   CARD regular rate  no JVD   VASCULAR ACCESS none   LYMPH negative neck, negative axillae   EXTR negative cyanosis/clubbing, positive edema   SKIN normal to palpation, skin turgor good   NEURO cranial nerves intact, motor/sensory function intact   PSYCH alert, A+O to time, place, person   ASSESSMENT AND PLAN:  Assessment/Admission Diagnosis left renal artery stenosis, high grade mild right renal artery stenosis poorly controlled HTN CAD and multiple other issues   Plan with poorly controlled HTN and renal artery stenosis, the paradigm generally favors intervention over medical management. Risks and benefits of  renal angiography and intervention were discussed.  He desires to proceed.  This will be scheduled as an outpatient.     level 3   Electronic Signatures: Algernon Huxley (MD)  (Signed 28-Aug-14 18:14)  Authored: Chief Complaint and History, PAST MEDICAL/SURGICAL HISTORY, ALLERGIES, HOME MEDICATIONS, Family and Social History, Review of Systems, Physical Exam, Assessment and Plan   Last Updated: 28-Aug-14 18:14 by Algernon Huxley (MD)

## 2015-01-17 NOTE — Op Note (Signed)
PATIENT NAME:  Justin Hancock, WIK MR#:  [id number] DATE OF BIRTH:  1927/07/04  DATE OF PROCEDURE:  07/02/2013  PREOPERATIVE DIAGNOSES: 1. High-grade left renal artery stenosis.  2. Severe hypertension on multiple medications.  3. Coronary disease.  4. Diabetes.   POSTOPERATIVE DIAGNOSES: 1. High-grade left renal artery stenosis.  2. Severe hypertension on multiple medications.  3. Coronary disease.  4. Diabetes.   PROCEDURES PERFORMED: 1. Ultrasound guidance for vascular access, right femoral artery.  2. Catheter placement at the  left renal artery from right femoral approach.  3. Aortogram, selective left renal angiogram. 4. Placement of a left renal artery stent using a 6.5 mm diameter x 18 mm length balloon expandable stent and the PercuSurge embolic protection device.  5. StarClose to close the right femoral artery.   SURGEON: Algernon Huxley, M.D.   ANESTHESIA: Local with moderate conscious sedation.   ESTIMATED BLOOD LOSS: 25 mL.   INDICATION FOR PROCEDURE: An 81 year old white male with severe hypertension on multiple medications. He had a cardiac catheterization done about a month ago which demonstrated significant left renal artery stenosis. He is brought back today for treatment of this due to his severe poorly-controlled hypertension on multiple medications and his renal artery stenosis. The risks and benefits were discussed. Informed consent was obtained.   DESCRIPTION OF PROCEDURE: The patient was brought to the vascular and interventional radiology suite. Groins were shaved and prepped, and a sterile surgical field was created. Ultrasound was used to visualize the femoral artery on the right and ultrasound was used to access the patent right femoral artery. Permanent image was recorded. A 6 French sheath was then placed. Pigtail catheter was placed into the aorta at the L1 level and AP aortogram was performed. This showed what appeared to be single renal vessels with a  significant stenosis of the left renal artery, consistent with what we had seen on a previous aortogram on his catheterization. An IM guide catheter was used to selectively cannulate the left renal artery, and selective imaging was performed. This demonstrated an approximately 85% stenosis over the first 1 cm of the vessel. The vessel was somewhat generous in size measuring about 6.5 mm in diameter. The Medtronic PercuSurge embolic protection device was used to cross the lesion without difficulty and parked in the distal main renal artery. The main renal artery itself was occluded, but the  2 branches that came off simultaneously both inferior and superior had some flow in them, but the vessel was too large to get a big enough balloon inflation to completely occlude these vessels. A 6.5 mm diameter x 18 mm length balloon expandable stent was then selected, brought into the renal artery across the lesion leaving it approximately 2 mm back in the aorta with its deployment. It was deployed with a tight waist present  which resolved with balloon inflation at 10 atmospheres. The aspiration gas was then used to aspirate blood from the renal artery, and this was flushed with heparinized saline, PercuSurge balloon was then deflated. Total inflation time was less than five minutes. Completion angiogram showed markedly improved flow with no significant residual stenosis after stent placement and a good nephrogram. At this point, I elected to terminate the procedure. The sheath was removed. A StarClose closure device was deployed in the usual fashion with excellent hemostatic result. The patient tolerated the procedure well and was taken to the recovery room in stable condition.    ____________________________ Algernon Huxley, MD jsd:sg D: 07/02/2013 11:58:30 ET  T: 07/02/2013 12:15:14 ET JOB#: [id number]  cc: Algernon Huxley, MD, <Dictator> Irven Easterly. Kary Kos, MD   Algernon Huxley MD ELECTRONICALLY SIGNED 07/18/2013 10:20

## 2015-11-04 DIAGNOSIS — I150 Renovascular hypertension: Secondary | ICD-10-CM | POA: Diagnosis not present

## 2015-11-04 DIAGNOSIS — M79609 Pain in unspecified limb: Secondary | ICD-10-CM | POA: Diagnosis not present

## 2015-11-04 DIAGNOSIS — I499 Cardiac arrhythmia, unspecified: Secondary | ICD-10-CM | POA: Diagnosis not present

## 2015-11-04 DIAGNOSIS — E119 Type 2 diabetes mellitus without complications: Secondary | ICD-10-CM | POA: Diagnosis not present

## 2015-11-04 DIAGNOSIS — I701 Atherosclerosis of renal artery: Secondary | ICD-10-CM | POA: Diagnosis not present

## 2015-11-04 DIAGNOSIS — I2510 Atherosclerotic heart disease of native coronary artery without angina pectoris: Secondary | ICD-10-CM | POA: Diagnosis not present

## 2015-11-04 DIAGNOSIS — I739 Peripheral vascular disease, unspecified: Secondary | ICD-10-CM | POA: Diagnosis not present

## 2015-11-04 DIAGNOSIS — I70213 Atherosclerosis of native arteries of extremities with intermittent claudication, bilateral legs: Secondary | ICD-10-CM | POA: Diagnosis not present

## 2015-11-05 DIAGNOSIS — N182 Chronic kidney disease, stage 2 (mild): Secondary | ICD-10-CM | POA: Diagnosis not present

## 2015-11-05 DIAGNOSIS — I2510 Atherosclerotic heart disease of native coronary artery without angina pectoris: Secondary | ICD-10-CM | POA: Diagnosis not present

## 2015-11-05 DIAGNOSIS — E119 Type 2 diabetes mellitus without complications: Secondary | ICD-10-CM | POA: Diagnosis not present

## 2015-11-05 DIAGNOSIS — E669 Obesity, unspecified: Secondary | ICD-10-CM | POA: Diagnosis not present

## 2015-11-05 DIAGNOSIS — I5022 Chronic systolic (congestive) heart failure: Secondary | ICD-10-CM | POA: Diagnosis not present

## 2015-11-05 DIAGNOSIS — G4733 Obstructive sleep apnea (adult) (pediatric): Secondary | ICD-10-CM | POA: Diagnosis not present

## 2015-11-05 DIAGNOSIS — E784 Other hyperlipidemia: Secondary | ICD-10-CM | POA: Diagnosis not present

## 2015-11-05 DIAGNOSIS — M1990 Unspecified osteoarthritis, unspecified site: Secondary | ICD-10-CM | POA: Diagnosis not present

## 2015-11-05 DIAGNOSIS — I209 Angina pectoris, unspecified: Secondary | ICD-10-CM | POA: Diagnosis not present

## 2015-11-07 DIAGNOSIS — H353132 Nonexudative age-related macular degeneration, bilateral, intermediate dry stage: Secondary | ICD-10-CM | POA: Diagnosis not present

## 2015-11-25 ENCOUNTER — Other Ambulatory Visit: Payer: Self-pay | Admitting: Vascular Surgery

## 2015-11-27 ENCOUNTER — Encounter
Admission: RE | Disposition: A | Discharge status: Home / Self Care | Payer: Self-pay | Source: Ambulatory Visit | Attending: Vascular Surgery

## 2015-11-27 ENCOUNTER — Encounter: Payer: Self-pay | Admitting: *Deleted

## 2015-11-27 ENCOUNTER — Ambulatory Visit
Admission: RE | Admit: 2015-11-27 | Discharge: 2015-11-27 | Disposition: A | Discharge status: Home / Self Care | Payer: PPO | Source: Ambulatory Visit | Attending: Vascular Surgery | Admitting: Vascular Surgery

## 2015-11-27 DIAGNOSIS — E785 Hyperlipidemia, unspecified: Secondary | ICD-10-CM | POA: Diagnosis not present

## 2015-11-27 DIAGNOSIS — Z9889 Other specified postprocedural states: Secondary | ICD-10-CM | POA: Insufficient documentation

## 2015-11-27 DIAGNOSIS — I701 Atherosclerosis of renal artery: Secondary | ICD-10-CM | POA: Insufficient documentation

## 2015-11-27 DIAGNOSIS — I2510 Atherosclerotic heart disease of native coronary artery without angina pectoris: Secondary | ICD-10-CM | POA: Insufficient documentation

## 2015-11-27 DIAGNOSIS — M79609 Pain in unspecified limb: Secondary | ICD-10-CM | POA: Diagnosis not present

## 2015-11-27 DIAGNOSIS — Z79899 Other long term (current) drug therapy: Secondary | ICD-10-CM | POA: Diagnosis not present

## 2015-11-27 DIAGNOSIS — I10 Essential (primary) hypertension: Secondary | ICD-10-CM | POA: Insufficient documentation

## 2015-11-27 DIAGNOSIS — I150 Renovascular hypertension: Secondary | ICD-10-CM | POA: Diagnosis not present

## 2015-11-27 DIAGNOSIS — I70211 Atherosclerosis of native arteries of extremities with intermittent claudication, right leg: Secondary | ICD-10-CM | POA: Diagnosis not present

## 2015-11-27 DIAGNOSIS — Z859 Personal history of malignant neoplasm, unspecified: Secondary | ICD-10-CM | POA: Insufficient documentation

## 2015-11-27 DIAGNOSIS — I739 Peripheral vascular disease, unspecified: Secondary | ICD-10-CM | POA: Diagnosis not present

## 2015-11-27 DIAGNOSIS — Z7984 Long term (current) use of oral hypoglycemic drugs: Secondary | ICD-10-CM | POA: Diagnosis not present

## 2015-11-27 DIAGNOSIS — Z7982 Long term (current) use of aspirin: Secondary | ICD-10-CM | POA: Insufficient documentation

## 2015-11-27 DIAGNOSIS — I499 Cardiac arrhythmia, unspecified: Secondary | ICD-10-CM | POA: Diagnosis not present

## 2015-11-27 DIAGNOSIS — E119 Type 2 diabetes mellitus without complications: Secondary | ICD-10-CM | POA: Insufficient documentation

## 2015-11-27 DIAGNOSIS — I70213 Atherosclerosis of native arteries of extremities with intermittent claudication, bilateral legs: Secondary | ICD-10-CM | POA: Diagnosis not present

## 2015-11-27 HISTORY — PX: PERIPHERAL VASCULAR CATHETERIZATION: SHX172C

## 2015-11-27 HISTORY — DX: Peripheral vascular disease, unspecified: I73.9

## 2015-11-27 HISTORY — DX: Type 2 diabetes mellitus without complications: E11.9

## 2015-11-27 HISTORY — DX: Atherosclerotic heart disease of native coronary artery without angina pectoris: I25.10

## 2015-11-27 HISTORY — DX: Essential (primary) hypertension: I10

## 2015-11-27 HISTORY — DX: Malignant (primary) neoplasm, unspecified: C80.1

## 2015-11-27 HISTORY — DX: Chronic kidney disease, unspecified: N18.9

## 2015-11-27 LAB — CREATININE, SERUM
CREATININE: 1.36 mg/dL — AB (ref 0.61–1.24)
GFR calc Af Amer: 53 mL/min — ABNORMAL LOW (ref >60)
GFR, EST NON AFRICAN AMERICAN: 45 mL/min — AB (ref >60)

## 2015-11-27 LAB — BUN: BUN: 26 mg/dL — ABNORMAL HIGH (ref 6–20)

## 2015-11-27 SURGERY — ABDOMINAL AORTOGRAM W/LOWER EXTREMITY
Wound class: Clean

## 2015-11-27 MED ORDER — MIDAZOLAM HCL 5 MG/5ML IJ SOLN
INTRAMUSCULAR | Status: AC
  Filled 2015-11-27: qty 5

## 2015-11-27 MED ORDER — CLINDAMYCIN PHOSPHATE 300 MG/50ML IV SOLN
INTRAVENOUS | Status: AC
Start: 2015-11-27 — End: 2015-11-27
  Administered 2015-11-27: 08:00:00
  Filled 2015-11-27: qty 50

## 2015-11-27 MED ORDER — FENTANYL CITRATE (PF) 100 MCG/2ML IJ SOLN
INTRAMUSCULAR | Status: DC | PRN
  Administered 2015-11-27 (×2): 50 ug via INTRAVENOUS

## 2015-11-27 MED ORDER — ACETAMINOPHEN 325 MG RE SUPP
325.0000 mg | RECTAL | Status: DC | PRN
  Filled 2015-11-27: qty 2

## 2015-11-27 MED ORDER — ACETAMINOPHEN 325 MG PO TABS: 325.0000 mg | ORAL_TABLET | ORAL | Status: DC | PRN

## 2015-11-27 MED ORDER — HEPARIN SODIUM (PORCINE) 1000 UNIT/ML IJ SOLN
INTRAMUSCULAR | Status: DC | PRN
  Administered 2015-11-27: 5000 [IU] via INTRAVENOUS

## 2015-11-27 MED ORDER — SODIUM BICARBONATE 8.4 % IV SOLN
INTRAVENOUS | Status: DC
  Filled 2015-11-27: qty 850

## 2015-11-27 MED ORDER — SODIUM BICARBONATE 8.4 % IV SOLN: INTRAVENOUS | Status: DC

## 2015-11-27 MED ORDER — SODIUM CHLORIDE 0.9 % IV SOLN: 500.0000 mL | Freq: Once | INTRAVENOUS | Status: DC | PRN

## 2015-11-27 MED ORDER — LIDOCAINE-EPINEPHRINE (PF) 1 %-1:200000 IJ SOLN
INTRAMUSCULAR | Status: AC
  Filled 2015-11-27: qty 30

## 2015-11-27 MED ORDER — LABETALOL HCL 5 MG/ML IV SOLN
INTRAVENOUS | Status: AC
  Administered 2015-11-27: 10 mg via INTRAVENOUS
  Filled 2015-11-27: qty 4

## 2015-11-27 MED ORDER — HYDRALAZINE HCL 20 MG/ML IJ SOLN: 5.0000 mg | INTRAMUSCULAR | Status: DC | PRN

## 2015-11-27 MED ORDER — SODIUM BICARBONATE BOLUS VIA INFUSION: INTRAVENOUS | Status: DC

## 2015-11-27 MED ORDER — HEPARIN SODIUM (PORCINE) 1000 UNIT/ML IJ SOLN
INTRAMUSCULAR | Status: AC
  Filled 2015-11-27: qty 1

## 2015-11-27 MED ORDER — CLINDAMYCIN PHOSPHATE 300 MG/50ML IV SOLN: 300.0000 mg | Freq: Once | INTRAVENOUS | Status: DC

## 2015-11-27 MED ORDER — MIDAZOLAM HCL 2 MG/2ML IJ SOLN
INTRAMUSCULAR | Status: DC | PRN
  Administered 2015-11-27: 2 mg via INTRAVENOUS
  Administered 2015-11-27: 1 mg via INTRAVENOUS

## 2015-11-27 MED ORDER — DEXTROSE 5 % IV SOLN
INTRAVENOUS | Status: AC
  Filled 2015-11-27 (×35): qty 1.5

## 2015-11-27 MED ORDER — HYDRALAZINE HCL 20 MG/ML IJ SOLN
INTRAMUSCULAR | Status: AC
  Filled 2015-11-27: qty 1

## 2015-11-27 MED ORDER — GUAIFENESIN-DM 100-10 MG/5ML PO SYRP
15.0000 mL | ORAL_SOLUTION | ORAL | Status: DC | PRN
  Filled 2015-11-27: qty 15

## 2015-11-27 MED ORDER — LIDOCAINE-EPINEPHRINE (PF) 1 %-1:200000 IJ SOLN
INTRAMUSCULAR | Status: DC | PRN
  Administered 2015-11-27: 10 mL via INTRADERMAL

## 2015-11-27 MED ORDER — HEPARIN (PORCINE) IN NACL 2-0.9 UNIT/ML-% IJ SOLN
INTRAMUSCULAR | Status: AC
  Filled 2015-11-27: qty 1000

## 2015-11-27 MED ORDER — HYDROMORPHONE HCL 1 MG/ML IJ SOLN: 1.0000 mg | Freq: Once | INTRAMUSCULAR | Status: DC

## 2015-11-27 MED ORDER — METHYLPREDNISOLONE SODIUM SUCC 125 MG IJ SOLR: 125.0000 mg | INTRAMUSCULAR | Status: DC | PRN

## 2015-11-27 MED ORDER — PHENOL 1.4 % MT LIQD: 1.0000 | OROMUCOSAL | Status: DC | PRN

## 2015-11-27 MED ORDER — OXYCODONE-ACETAMINOPHEN 5-325 MG PO TABS: 1.0000 | ORAL_TABLET | ORAL | Status: DC | PRN

## 2015-11-27 MED ORDER — ONDANSETRON HCL 4 MG/2ML IJ SOLN: 4.0000 mg | Freq: Four times a day (QID) | INTRAMUSCULAR | Status: DC | PRN

## 2015-11-27 MED ORDER — IOHEXOL 300 MG/ML  SOLN
INTRAMUSCULAR | Status: DC | PRN
  Administered 2015-11-27: 70 mL via INTRA_ARTERIAL

## 2015-11-27 MED ORDER — SODIUM BICARBONATE BOLUS VIA INFUSION
INTRAVENOUS | Status: AC
  Administered 2015-11-27: 09:00:00 via INTRAVENOUS
  Filled 2015-11-27: qty 1

## 2015-11-27 MED ORDER — HYDROMORPHONE HCL 1 MG/ML IJ SOLN: 0.5000 mg | INTRAMUSCULAR | Status: DC | PRN

## 2015-11-27 MED ORDER — DEXTROSE 5 % IV SOLN: 1.5000 g | INTRAVENOUS | Status: DC

## 2015-11-27 MED ORDER — SODIUM CHLORIDE 0.9 % IV SOLN
INTRAVENOUS | Status: DC
  Administered 2015-11-27: 08:00:00 via INTRAVENOUS

## 2015-11-27 MED ORDER — METOPROLOL TARTRATE 1 MG/ML IV SOLN: 2.0000 mg | INTRAVENOUS | Status: DC | PRN

## 2015-11-27 MED ORDER — LABETALOL HCL 5 MG/ML IV SOLN
10.0000 mg | INTRAVENOUS | Status: DC | PRN
  Administered 2015-11-27: 10 mg via INTRAVENOUS

## 2015-11-27 MED ORDER — FAMOTIDINE 20 MG PO TABS: 40.0000 mg | ORAL_TABLET | ORAL | Status: DC | PRN

## 2015-11-27 MED ORDER — FENTANYL CITRATE (PF) 100 MCG/2ML IJ SOLN
INTRAMUSCULAR | Status: AC
  Filled 2015-11-27: qty 2

## 2015-11-27 SURGICAL SUPPLY — 21 items
BALLN LUTONIX DCB 5X80X130 (BALLOONS) ×5
BALLN ULTRVRSE 2.5X220X150 (BALLOONS) ×5
BALLOON LUTONIX DCB 5X80X130 (BALLOONS) ×1 IMPLANT
BALLOON ULTRVRSE 2.5X220X150 (BALLOONS) ×2 IMPLANT
CATH CXI 4F 90 DAV (MICROCATHETER) ×4 IMPLANT
CATH CXI SUPP ANG 4FR 135 (MICROCATHETER) ×2 IMPLANT
CATH CXI SUPP ANG 4FR 135CM (MICROCATHETER) ×5
CATH PIG 70CM (CATHETERS) ×5 IMPLANT
CATH RIM 65CM (CATHETERS) ×5 IMPLANT
CATH VERT 100CM (CATHETERS) ×3 IMPLANT
DEVICE PRESTO INFLATION (MISCELLANEOUS) ×5 IMPLANT
DEVICE STARCLOSE SE CLOSURE (Vascular Products) ×3 IMPLANT
DEVICE TORQUE (MISCELLANEOUS) ×5 IMPLANT
GLIDEWIRE ADV .035X260CM (WIRE) ×5 IMPLANT
PACK ANGIOGRAPHY (CUSTOM PROCEDURE TRAY) ×5 IMPLANT
SHEATH BRITE TIP 5FRX11 (SHEATH) ×5 IMPLANT
SHEATH HIGHFLEX ANSEL 6FRX55 (SHEATH) ×3 IMPLANT
SYR MEDRAD MARK V 150ML (SYRINGE) ×5 IMPLANT
TUBING CONTRAST HIGH PRESS 72 (TUBING) ×5 IMPLANT
WIRE G V18X300CM (WIRE) ×3 IMPLANT
WIRE J 3MM .035X145CM (WIRE) ×5 IMPLANT

## 2015-11-27 NOTE — Progress Notes (Addendum)
Patient developed hematoma, level 3, at insertion site. Pressure held x 20 minutes, medication administered to address BP per MD. Hematoma resolved, BP improved. Will remain flat until 11:30 and plan to discharge by noon per MD provided no additional complications.

## 2015-11-27 NOTE — Discharge Instructions (Signed)
Angiogram, Care After °Refer to this sheet in the next few weeks. These instructions provide you with information about caring for yourself after your procedure. Your health care provider may also give you more specific instructions. Your treatment has been planned according to current medical practices, but problems sometimes occur. Call your health care provider if you have any problems or questions after your procedure. °WHAT TO EXPECT AFTER THE PROCEDURE °After your procedure, it is typical to have the following: °· Bruising at the catheter insertion site that usually fades within 1-2 weeks. °· Blood collecting in the tissue (hematoma) that may be painful to the touch. It should usually decrease in size and tenderness within 1-2 weeks. °HOME CARE INSTRUCTIONS °· Take medicines only as directed by your health care provider. °· You may shower 24-48 hours after the procedure or as directed by your health care provider. Remove the bandage (dressing) and gently wash the site with plain soap and water. Pat the area dry with a clean towel. Do not rub the site, because this may cause bleeding. °· Do not take baths, swim, or use a hot tub until your health care provider approves. °· Check your insertion site every day for redness, swelling, or drainage. °· Do not apply powder or lotion to the site. °· Do not lift over 10 lb (4.5 kg) for 5 days after your procedure or as directed by your health care provider. °· Ask your health care provider when it is okay to: °¨ Return to work or school. °¨ Resume usual physical activities or sports. °¨ Resume sexual activity. °· Do not drive home if you are discharged the same day as the procedure. Have someone else drive you. °· You may drive 24 hours after the procedure unless otherwise instructed by your health care provider. °· Do not operate machinery or power tools for 24 hours after the procedure or as directed by your health care provider. °· If your procedure was done as an  outpatient procedure, which means that you went home the same day as your procedure, a responsible adult should be with you for the first 24 hours after you arrive home. °· Keep all follow-up visits as directed by your health care provider. This is important. °SEEK MEDICAL CARE IF: °· You have a fever. °· You have chills. °· You have increased bleeding from the catheter insertion site. Hold pressure on the site. °SEEK IMMEDIATE MEDICAL CARE IF: °· You have unusual pain at the catheter insertion site. °· You have redness, warmth, or swelling at the catheter insertion site. °· You have drainage (other than a small amount of blood on the dressing) from the catheter insertion site. °· The catheter insertion site is bleeding, and the bleeding does not stop after 30 minutes of holding steady pressure on the site. °· The area near or just beyond the catheter insertion site becomes pale, cool, tingly, or numb. °  °This information is not intended to replace advice given to you by your health care provider. Make sure you discuss any questions you have with your health care provider. °  °Document Released: 04/01/2005 Document Revised: 10/04/2014 Document Reviewed: 02/14/2013 °Elsevier Interactive Patient Education ©2016 [employer] ° °

## 2015-11-27 NOTE — Op Note (Signed)
[facility] Percutaneous Study/Intervention Procedural Note   Date of Surgery: 11/27/2015  Surgeon(s):Regina Coppolino   Assistants:none  Pre-operative Diagnosis: PAD with claudication bilateral lower extremities right greater than left  Post-operative diagnosis: Same  Procedure(s) Performed: 1. Ultrasound guidance for vascular access left femoral artery 2. Catheter placement into right peroneal artery from left femoral approach 3. Aortogram and selective right lower extremity angiogram 4. Percutaneous transluminal angioplasty of right peroneal artery and tibioperoneal trunk with 2.5 mm diameter by 22 cm length balloon 5. Percutaneous transluminal angioplasty of right popliteal artery with 5 mm diameter by 8 cm angioplasty balloon using a Lutonix drug-coated balloon  6. StarClose closure device left femoral artery  EBL: Minimal  Contrast: 70 cc  Fluro Time: 9.8 minutes  Moderate Conscious Sedation Time: approximately 50 minutes using 3 mg of Versed and 100 mcg of Fentanyl  Indications: Patient is a 82 y.o.male with significant peripheral vascular disease and pain with activity system with claudication. The patient has noninvasive study showing significant peripheral arterial disease more on the right than the left. The patient is brought in for angiography for further evaluation and potential treatment. Risks and benefits are discussed and informed consent is obtained  Procedure: The patient was identified and appropriate procedural time out was performed. The patient was then placed supine on the table and prepped and draped in the usual sterile fashion.Moderate conscious sedation was administered during a face to face encounter with the patient throughout the procedure with my supervision of the RN administering medicines and monitoring the patient's vital signs, pulse oximetry,  telemetry and mental status throughout from the start of the procedure until the patient was taken to the recovery room. Ultrasound was used to evaluate the left common femoral artery. It was patent . A digital ultrasound image was acquired. A Seldinger needle was used to access the left common femoral artery under direct ultrasound guidance and a permanent image was performed. A 0.035 J wire was advanced without resistance and a 5Fr sheath was placed. Pigtail catheter was placed into the aorta and an AP aortogram was performed. The Aorta was widely patent. Iliac arteries were widely patent but markedly tortuous. Right renal artery was patent without significant stenosis. Left renal artery stent had what looked to be reasonably normal flow but detailed evaluation was limited on today's study. I then crossed the aortic bifurcation and advanced to the right femoral head. Selective right lower extremity angiogram was then performed. This demonstrated diffuse calcific plaque of the SFA without significant stenosis of more than 30%. The popliteal artery had about a 70-80% stenosis just below the knee. The anterior tibial artery had a proximal stenosis and then occluded in its midsegment without significant reconstitution distally. The posterior tibial artery was chronically occluded. The peroneal artery had a medium length occlusion in the proximal to mid segment but distal reconstitution with the best flow of the 3 vessels. The patient was systemically heparinized and a 6 [place] Ansell sheath was then placed over the [employer] wire. I then used a Kumpe catheter and the advantage wire to navigate through the popliteal stenosis and confirm intraluminal flow below this. I then exchanged for a 0.018 wire and a 135 cm length CXI catheter. I crossed the peroneal artery occlusion without difficulty and confirm intraluminal flow in the mid to distal peroneal artery with the CXI catheter. I then replaced a 0.018 wire  and began treatment. The peroneal artery and tibioperoneal trunk were treated with an inflation of a  2.5 mm diameter by 22 cm length angioplasty balloon inflated to 12 atm for 1 minute. The balloon was then deflated and I turned my attention to the popliteal artery. A 5 mm diameter by 8 cm length Lutonix drug-coated angioplasty balloon was inflated to 10 atm for 1 minute to treat the popliteal artery stenosis at and just below the knee. Completion angiogram following these inflation showed less than 10% residual stenosis in the popliteal artery and less than 20% residual stenosis in the peroneal artery with brisk flow. Attempts at crossing the anterior tibial artery proximal high-grade stenosis were not feasible due to the steep angulation and the poor flow within the vessel. With no distal target to treat in the anterior tibial artery, these efforts were abandoned. I elected to terminate the procedure. The sheath was removed and StarClose closure device was deployed in the left femoral artery with excellent hemostatic result. The patient was taken to the recovery room in stable condition having tolerated the procedure well.  Findings:  Aortogram: Aorta widely patent. Iliac arteries were widely patent but markedly tortuous. Right renal artery was patent without significant stenosis. Left renal artery stent had what looked to be reasonably normal flow but detailed evaluation was limited on today's study. Left Lower Extremity: diffuse calcific plaque of the SFA without significant stenosis of more than 30%. The popliteal artery had about a 70-80% stenosis just below the knee. The anterior tibial artery had a proximal stenosis and then occluded in its midsegment without significant reconstitution distally. The posterior tibial artery was chronically occluded. The peroneal artery had a medium length occlusion in the proximal to mid segment but distal reconstitution with the best flow of  the 3 vessels   Disposition: Patient was taken to the recovery room in stable condition having tolerated the procedure well.  Complications: None  Leya Paige 11/27/2015 9:19 AM

## 2015-11-27 NOTE — H&P (Signed)
  [facility] History & Physical Update  The patient was interviewed and re-examined.  The patient's previous History and Physical has been reviewed and is unchanged.  There is no change in the plan of care. We plan to proceed with the scheduled procedure.  DEW,JASON, MD  11/27/2015, 8:01 AM

## 2015-11-28 ENCOUNTER — Encounter: Payer: Self-pay | Admitting: Vascular Surgery

## 2015-12-25 DIAGNOSIS — L821 Other seborrheic keratosis: Secondary | ICD-10-CM | POA: Diagnosis not present

## 2015-12-25 DIAGNOSIS — I499 Cardiac arrhythmia, unspecified: Secondary | ICD-10-CM | POA: Diagnosis not present

## 2015-12-25 DIAGNOSIS — I70213 Atherosclerosis of native arteries of extremities with intermittent claudication, bilateral legs: Secondary | ICD-10-CM | POA: Diagnosis not present

## 2015-12-25 DIAGNOSIS — L859 Epidermal thickening, unspecified: Secondary | ICD-10-CM | POA: Diagnosis not present

## 2015-12-25 DIAGNOSIS — I70211 Atherosclerosis of native arteries of extremities with intermittent claudication, right leg: Secondary | ICD-10-CM | POA: Diagnosis not present

## 2015-12-25 DIAGNOSIS — I2510 Atherosclerotic heart disease of native coronary artery without angina pectoris: Secondary | ICD-10-CM | POA: Diagnosis not present

## 2015-12-25 DIAGNOSIS — Z1283 Encounter for screening for malignant neoplasm of skin: Secondary | ICD-10-CM | POA: Diagnosis not present

## 2015-12-25 DIAGNOSIS — L820 Inflamed seborrheic keratosis: Secondary | ICD-10-CM | POA: Diagnosis not present

## 2015-12-25 DIAGNOSIS — L570 Actinic keratosis: Secondary | ICD-10-CM | POA: Diagnosis not present

## 2015-12-25 DIAGNOSIS — D1800 Hemangioma unspecified site: Secondary | ICD-10-CM | POA: Diagnosis not present

## 2015-12-25 DIAGNOSIS — I739 Peripheral vascular disease, unspecified: Secondary | ICD-10-CM | POA: Diagnosis not present

## 2015-12-25 DIAGNOSIS — E785 Hyperlipidemia, unspecified: Secondary | ICD-10-CM | POA: Diagnosis not present

## 2015-12-25 DIAGNOSIS — L812 Freckles: Secondary | ICD-10-CM | POA: Diagnosis not present

## 2015-12-25 DIAGNOSIS — E119 Type 2 diabetes mellitus without complications: Secondary | ICD-10-CM | POA: Diagnosis not present

## 2015-12-25 DIAGNOSIS — D229 Melanocytic nevi, unspecified: Secondary | ICD-10-CM | POA: Diagnosis not present

## 2015-12-25 DIAGNOSIS — D692 Other nonthrombocytopenic purpura: Secondary | ICD-10-CM | POA: Diagnosis not present

## 2015-12-25 DIAGNOSIS — Z85828 Personal history of other malignant neoplasm of skin: Secondary | ICD-10-CM | POA: Diagnosis not present

## 2015-12-25 DIAGNOSIS — M79609 Pain in unspecified limb: Secondary | ICD-10-CM | POA: Diagnosis not present

## 2015-12-25 DIAGNOSIS — I150 Renovascular hypertension: Secondary | ICD-10-CM | POA: Diagnosis not present

## 2015-12-25 DIAGNOSIS — I8311 Varicose veins of right lower extremity with inflammation: Secondary | ICD-10-CM | POA: Diagnosis not present

## 2016-02-03 DIAGNOSIS — J019 Acute sinusitis, unspecified: Secondary | ICD-10-CM | POA: Diagnosis not present

## 2016-02-03 DIAGNOSIS — E669 Obesity, unspecified: Secondary | ICD-10-CM | POA: Diagnosis not present

## 2016-02-03 DIAGNOSIS — E119 Type 2 diabetes mellitus without complications: Secondary | ICD-10-CM | POA: Diagnosis not present

## 2016-02-03 DIAGNOSIS — I10 Essential (primary) hypertension: Secondary | ICD-10-CM | POA: Diagnosis not present

## 2016-02-03 DIAGNOSIS — E785 Hyperlipidemia, unspecified: Secondary | ICD-10-CM | POA: Diagnosis not present

## 2016-02-03 DIAGNOSIS — E784 Other hyperlipidemia: Secondary | ICD-10-CM | POA: Diagnosis not present

## 2016-02-03 DIAGNOSIS — G4733 Obstructive sleep apnea (adult) (pediatric): Secondary | ICD-10-CM | POA: Diagnosis not present

## 2016-02-03 DIAGNOSIS — R0602 Shortness of breath: Secondary | ICD-10-CM | POA: Diagnosis not present

## 2016-02-03 DIAGNOSIS — M1990 Unspecified osteoarthritis, unspecified site: Secondary | ICD-10-CM | POA: Diagnosis not present

## 2016-02-03 DIAGNOSIS — I2510 Atherosclerotic heart disease of native coronary artery without angina pectoris: Secondary | ICD-10-CM | POA: Diagnosis not present

## 2016-02-03 DIAGNOSIS — D649 Anemia, unspecified: Secondary | ICD-10-CM | POA: Diagnosis not present

## 2016-02-03 DIAGNOSIS — N182 Chronic kidney disease, stage 2 (mild): Secondary | ICD-10-CM | POA: Diagnosis not present

## 2016-02-03 DIAGNOSIS — I5022 Chronic systolic (congestive) heart failure: Secondary | ICD-10-CM | POA: Diagnosis not present

## 2016-02-03 DIAGNOSIS — I209 Angina pectoris, unspecified: Secondary | ICD-10-CM | POA: Diagnosis not present

## 2016-02-06 DIAGNOSIS — E1140 Type 2 diabetes mellitus with diabetic neuropathy, unspecified: Secondary | ICD-10-CM | POA: Diagnosis not present

## 2016-02-06 DIAGNOSIS — L851 Acquired keratosis [keratoderma] palmaris et plantaris: Secondary | ICD-10-CM | POA: Diagnosis not present

## 2016-02-06 DIAGNOSIS — B351 Tinea unguium: Secondary | ICD-10-CM | POA: Diagnosis not present

## 2016-04-06 DIAGNOSIS — I739 Peripheral vascular disease, unspecified: Secondary | ICD-10-CM | POA: Diagnosis not present

## 2016-04-06 DIAGNOSIS — I70213 Atherosclerosis of native arteries of extremities with intermittent claudication, bilateral legs: Secondary | ICD-10-CM | POA: Diagnosis not present

## 2016-04-06 DIAGNOSIS — M79609 Pain in unspecified limb: Secondary | ICD-10-CM | POA: Diagnosis not present

## 2016-04-06 DIAGNOSIS — E785 Hyperlipidemia, unspecified: Secondary | ICD-10-CM | POA: Diagnosis not present

## 2016-04-06 DIAGNOSIS — E119 Type 2 diabetes mellitus without complications: Secondary | ICD-10-CM | POA: Diagnosis not present

## 2016-04-06 DIAGNOSIS — I2510 Atherosclerotic heart disease of native coronary artery without angina pectoris: Secondary | ICD-10-CM | POA: Diagnosis not present

## 2016-04-06 DIAGNOSIS — I150 Renovascular hypertension: Secondary | ICD-10-CM | POA: Diagnosis not present

## 2016-04-06 DIAGNOSIS — I499 Cardiac arrhythmia, unspecified: Secondary | ICD-10-CM | POA: Diagnosis not present

## 2016-04-06 DIAGNOSIS — I70211 Atherosclerosis of native arteries of extremities with intermittent claudication, right leg: Secondary | ICD-10-CM | POA: Diagnosis not present

## 2016-04-08 DIAGNOSIS — R319 Hematuria, unspecified: Secondary | ICD-10-CM | POA: Diagnosis not present

## 2016-04-26 DIAGNOSIS — R319 Hematuria, unspecified: Secondary | ICD-10-CM | POA: Diagnosis not present

## 2016-05-05 DIAGNOSIS — E669 Obesity, unspecified: Secondary | ICD-10-CM | POA: Diagnosis not present

## 2016-05-05 DIAGNOSIS — I5022 Chronic systolic (congestive) heart failure: Secondary | ICD-10-CM | POA: Diagnosis not present

## 2016-05-05 DIAGNOSIS — M1990 Unspecified osteoarthritis, unspecified site: Secondary | ICD-10-CM | POA: Diagnosis not present

## 2016-05-05 DIAGNOSIS — E119 Type 2 diabetes mellitus without complications: Secondary | ICD-10-CM | POA: Diagnosis not present

## 2016-05-05 DIAGNOSIS — I209 Angina pectoris, unspecified: Secondary | ICD-10-CM | POA: Diagnosis not present

## 2016-05-05 DIAGNOSIS — N182 Chronic kidney disease, stage 2 (mild): Secondary | ICD-10-CM | POA: Diagnosis not present

## 2016-05-05 DIAGNOSIS — G4733 Obstructive sleep apnea (adult) (pediatric): Secondary | ICD-10-CM | POA: Diagnosis not present

## 2016-05-05 DIAGNOSIS — I2510 Atherosclerotic heart disease of native coronary artery without angina pectoris: Secondary | ICD-10-CM | POA: Diagnosis not present

## 2016-05-05 DIAGNOSIS — R0602 Shortness of breath: Secondary | ICD-10-CM | POA: Diagnosis not present

## 2016-05-05 DIAGNOSIS — E784 Other hyperlipidemia: Secondary | ICD-10-CM | POA: Diagnosis not present

## 2016-05-05 DIAGNOSIS — I493 Ventricular premature depolarization: Secondary | ICD-10-CM | POA: Diagnosis not present

## 2016-05-07 DIAGNOSIS — H353132 Nonexudative age-related macular degeneration, bilateral, intermediate dry stage: Secondary | ICD-10-CM | POA: Diagnosis not present

## 2016-05-07 DIAGNOSIS — H2513 Age-related nuclear cataract, bilateral: Secondary | ICD-10-CM | POA: Diagnosis not present

## 2016-05-26 DIAGNOSIS — B351 Tinea unguium: Secondary | ICD-10-CM | POA: Diagnosis not present

## 2016-05-26 DIAGNOSIS — L851 Acquired keratosis [keratoderma] palmaris et plantaris: Secondary | ICD-10-CM | POA: Diagnosis not present

## 2016-05-26 DIAGNOSIS — E1140 Type 2 diabetes mellitus with diabetic neuropathy, unspecified: Secondary | ICD-10-CM | POA: Diagnosis not present

## 2016-06-23 DIAGNOSIS — I70213 Atherosclerosis of native arteries of extremities with intermittent claudication, bilateral legs: Secondary | ICD-10-CM | POA: Diagnosis not present

## 2016-06-23 DIAGNOSIS — E785 Hyperlipidemia, unspecified: Secondary | ICD-10-CM | POA: Diagnosis not present

## 2016-06-23 DIAGNOSIS — E119 Type 2 diabetes mellitus without complications: Secondary | ICD-10-CM | POA: Diagnosis not present

## 2016-06-23 DIAGNOSIS — I499 Cardiac arrhythmia, unspecified: Secondary | ICD-10-CM | POA: Diagnosis not present

## 2016-06-23 DIAGNOSIS — I70211 Atherosclerosis of native arteries of extremities with intermittent claudication, right leg: Secondary | ICD-10-CM | POA: Diagnosis not present

## 2016-06-23 DIAGNOSIS — I2510 Atherosclerotic heart disease of native coronary artery without angina pectoris: Secondary | ICD-10-CM | POA: Diagnosis not present

## 2016-06-23 DIAGNOSIS — I150 Renovascular hypertension: Secondary | ICD-10-CM | POA: Diagnosis not present

## 2016-06-23 DIAGNOSIS — I701 Atherosclerosis of renal artery: Secondary | ICD-10-CM | POA: Diagnosis not present

## 2016-06-23 DIAGNOSIS — M79609 Pain in unspecified limb: Secondary | ICD-10-CM | POA: Diagnosis not present

## 2016-06-23 DIAGNOSIS — I739 Peripheral vascular disease, unspecified: Secondary | ICD-10-CM | POA: Diagnosis not present

## 2016-06-25 DIAGNOSIS — E119 Type 2 diabetes mellitus without complications: Secondary | ICD-10-CM | POA: Diagnosis not present

## 2016-06-25 DIAGNOSIS — I70211 Atherosclerosis of native arteries of extremities with intermittent claudication, right leg: Secondary | ICD-10-CM | POA: Diagnosis not present

## 2016-06-25 DIAGNOSIS — I2510 Atherosclerotic heart disease of native coronary artery without angina pectoris: Secondary | ICD-10-CM | POA: Diagnosis not present

## 2016-06-25 DIAGNOSIS — I70213 Atherosclerosis of native arteries of extremities with intermittent claudication, bilateral legs: Secondary | ICD-10-CM | POA: Diagnosis not present

## 2016-06-25 DIAGNOSIS — I499 Cardiac arrhythmia, unspecified: Secondary | ICD-10-CM | POA: Diagnosis not present

## 2016-06-25 DIAGNOSIS — I150 Renovascular hypertension: Secondary | ICD-10-CM | POA: Diagnosis not present

## 2016-06-25 DIAGNOSIS — M79609 Pain in unspecified limb: Secondary | ICD-10-CM | POA: Diagnosis not present

## 2016-06-25 DIAGNOSIS — M7989 Other specified soft tissue disorders: Secondary | ICD-10-CM | POA: Diagnosis not present

## 2016-06-25 DIAGNOSIS — I701 Atherosclerosis of renal artery: Secondary | ICD-10-CM | POA: Diagnosis not present

## 2016-06-25 DIAGNOSIS — E785 Hyperlipidemia, unspecified: Secondary | ICD-10-CM | POA: Diagnosis not present

## 2016-06-25 DIAGNOSIS — I739 Peripheral vascular disease, unspecified: Secondary | ICD-10-CM | POA: Diagnosis not present

## 2016-06-28 DIAGNOSIS — D1800 Hemangioma unspecified site: Secondary | ICD-10-CM | POA: Diagnosis not present

## 2016-06-28 DIAGNOSIS — L570 Actinic keratosis: Secondary | ICD-10-CM | POA: Diagnosis not present

## 2016-06-28 DIAGNOSIS — L821 Other seborrheic keratosis: Secondary | ICD-10-CM | POA: Diagnosis not present

## 2016-06-28 DIAGNOSIS — L578 Other skin changes due to chronic exposure to nonionizing radiation: Secondary | ICD-10-CM | POA: Diagnosis not present

## 2016-06-28 DIAGNOSIS — Z1283 Encounter for screening for malignant neoplasm of skin: Secondary | ICD-10-CM | POA: Diagnosis not present

## 2016-06-28 DIAGNOSIS — L812 Freckles: Secondary | ICD-10-CM | POA: Diagnosis not present

## 2016-06-28 DIAGNOSIS — L720 Epidermal cyst: Secondary | ICD-10-CM | POA: Diagnosis not present

## 2016-06-28 DIAGNOSIS — D692 Other nonthrombocytopenic purpura: Secondary | ICD-10-CM | POA: Diagnosis not present

## 2016-06-28 DIAGNOSIS — L820 Inflamed seborrheic keratosis: Secondary | ICD-10-CM | POA: Diagnosis not present

## 2016-06-30 ENCOUNTER — Ambulatory Visit: Payer: Self-pay | Admitting: Urology

## 2016-06-30 ENCOUNTER — Encounter: Payer: Self-pay | Admitting: Urology

## 2016-08-04 DIAGNOSIS — N401 Enlarged prostate with lower urinary tract symptoms: Secondary | ICD-10-CM | POA: Diagnosis not present

## 2016-08-04 DIAGNOSIS — H539 Unspecified visual disturbance: Secondary | ICD-10-CM | POA: Diagnosis not present

## 2016-08-04 DIAGNOSIS — R319 Hematuria, unspecified: Secondary | ICD-10-CM | POA: Diagnosis not present

## 2016-08-04 DIAGNOSIS — I10 Essential (primary) hypertension: Secondary | ICD-10-CM | POA: Diagnosis not present

## 2016-08-04 DIAGNOSIS — R3911 Hesitancy of micturition: Secondary | ICD-10-CM | POA: Diagnosis not present

## 2016-08-16 ENCOUNTER — Ambulatory Visit: Payer: Self-pay | Admitting: Urology

## 2016-08-16 ENCOUNTER — Encounter: Payer: Self-pay | Admitting: Urology

## 2016-08-16 VITALS — BP 194/90 | HR 76 | Ht 72.0 in | Wt 249.9 lb

## 2016-08-16 DIAGNOSIS — N401 Enlarged prostate with lower urinary tract symptoms: Secondary | ICD-10-CM

## 2016-08-16 DIAGNOSIS — R310 Gross hematuria: Secondary | ICD-10-CM | POA: Diagnosis not present

## 2016-08-16 DIAGNOSIS — N138 Other obstructive and reflux uropathy: Secondary | ICD-10-CM

## 2016-08-16 LAB — MICROSCOPIC EXAMINATION
Bacteria, UA: NONE SEEN
Epithelial Cells (non renal): NONE SEEN /hpf (ref 0–10)
WBC, UA: NONE SEEN /hpf (ref 0–?)

## 2016-08-16 LAB — URINALYSIS, COMPLETE
BILIRUBIN UA: NEGATIVE
GLUCOSE, UA: NEGATIVE
KETONES UA: NEGATIVE
Leukocytes, UA: NEGATIVE
NITRITE UA: NEGATIVE
Protein, UA: NEGATIVE
UUROB: 0.2 mg/dL (ref 0.2–1.0)
pH, UA: 6 (ref 5.0–7.5)

## 2016-08-16 LAB — BLADDER SCAN AMB NON-IMAGING

## 2016-08-16 MED ORDER — FINASTERIDE 5 MG PO TABS: 5.0000 mg | ORAL_TABLET | Freq: Every day | ORAL | 4 refills | Status: AC

## 2016-08-16 NOTE — Progress Notes (Signed)
08/16/2016 3:43 PM   Justin Hancock [birth year] [id number]  Referring provider: Maryland Pink, MD [address] [facility] [place], [place] [address]  Chief Complaint  Patient presents with  . New Patient (Initial Visit)    gross hematuria referred by Dr. Illene Labrador    HPI: Patient is a 82 year old Caucasian male who presents today as a referral from their PCP, Dr. Kary Kos, for gross hematuria.    Patient stated he has been having gross hematuria intermittently since June 2017.  Antibiotics were given and the hematuria cleared, but then returned.  He has one documented UTI in 04/08/2016.    Patient doesn't have a prior history of microscopic hematuria.    He does not have a prior history of recurrent urinary tract infections, nephrolithiasis, trauma to the genitourinary tract or malignancies of the genitourinary tract.   He does have a prior history of BPH.  His PVR today is 95 mL.    He does not have a family medical history of nephrolithiasis, malignancies of the genitourinary tract or hematuria.   Today, he is having symptoms of frequent urination, urgency, dysuria, nocturia, incontinence, hesitancy, intermittency and a weak urinary stream.  His UA today demonstrates 3-10 RBC's/hpf.    He is not experiencing any suprapubic pain, abdominal pain or flank pain. He denies any recent fevers, chills, nausea or vomiting.   He has not had any recent imaging studies.   He is a former smoker, with a 1 ppd history.  Quit 60 years ago.  He is exposed to secondhand smoke.  He has worked with chemicals all his life.  He was a Social research officer, government.     PMH: Past Medical History:  Diagnosis Date  . Arthritis   . Cancer ([place])   . Chronic kidney disease   . Coronary artery disease   . Diabetes mellitus without complication ([place])   . Heart murmur   . Heartburn   . HLD (hyperlipidemia)   . Hypertension   . Peripheral vascular disease ([place])   . Sleep apnea     Surgical  History: Past Surgical History:  Procedure Laterality Date  . colon cancer surgery     x 2  . PERIPHERAL VASCULAR CATHETERIZATION N/A 11/27/2015   Procedure: Abdominal Aortogram w/Lower Extremity;  Surgeon: Algernon Huxley, MD;  Location: [facility];  Service: Cardiovascular;  Laterality: N/A;  . PERIPHERAL VASCULAR CATHETERIZATION  11/27/2015   Procedure: Lower Extremity Intervention;  Surgeon: Algernon Huxley, MD;  Location: [facility];  Service: Cardiovascular;;  . TONSILLECTOMY      Home Medications:    Medication List       Accurate as of 08/16/16  3:43 PM. Always use your most recent med list.          amLODipine 5 MG tablet Commonly known as:  NORVASC Take 5 mg by mouth daily.   aspirin 325 MG EC tablet Take 325 mg by mouth 2 (two) times daily.   clopidogrel 75 MG tablet Commonly known as:  PLAVIX Take 75 mg by mouth daily.   finasteride 5 MG tablet Commonly known as:  PROSCAR Take 1 tablet (5 mg total) by mouth daily.   furosemide 20 MG tablet Commonly known as:  LASIX Take by mouth.   hydrALAZINE 25 MG tablet Commonly known as:  APRESOLINE Take 75 mg by mouth 2 (two) times daily.   hydrochlorothiazide 12.5 MG tablet Commonly known as:  HYDRODIURIL Take by mouth.  isosorbide mononitrate 60 MG 24 hr tablet Commonly known as:  IMDUR Take 60 mg by mouth daily.   lisinopril 20 MG tablet Commonly known as:  PRINIVIL,ZESTRIL Take 20 mg by mouth 2 (two) times daily.   lovastatin 20 MG tablet Commonly known as:  MEVACOR Take 20 mg by mouth 2 (two) times daily.   metFORMIN 500 MG tablet Commonly known as:  GLUCOPHAGE Take 500 mg by mouth 2 (two) times daily with a meal.   metoprolol succinate 50 MG 24 hr tablet Commonly known as:  TOPROL-XL Take 50 mg by mouth 2 (two) times daily. Take with or immediately following a meal.   PRESERVISION AREDS 2+MULTI VIT PO Take by mouth.   tamsulosin 0.4 MG Caps capsule Commonly known as:   FLOMAX Take by mouth.   triamcinolone cream 0.1 % Commonly known as:  KENALOG Apply topically.   vitamin C 500 MG tablet Commonly known as:  ASCORBIC ACID Take by mouth.       Allergies:  Allergies  Allergen Reactions  . Penicillins     Family History: Family History  Problem Relation Age of Onset  . Kidney Stones Father   . Prostate cancer Neg Hx   . Kidney disease Neg Hx   . Bladder Cancer Neg Hx     Social History:  reports that he quit smoking about 62 years ago. He has never used smokeless tobacco. He reports that he does not drink alcohol or use drugs.  ROS: UROLOGY Frequent Urination?: Yes Hard to postpone urination?: Yes Burning/pain with urination?: Yes Get up at night to urinate?: Yes Leakage of urine?: Yes Urine stream starts and stops?: Yes Trouble starting stream?: Yes Do you have to strain to urinate?: No Blood in urine?: Yes Urinary tract infection?: Yes Sexually transmitted disease?: No Injury to kidneys or bladder?: No Painful intercourse?: No Weak stream?: Yes Erection problems?: Yes Penile pain?: No  Gastrointestinal Nausea?: No Vomiting?: No Indigestion/heartburn?: No Diarrhea?: Yes Constipation?: No  Constitutional Fever: No Night sweats?: No Weight loss?: No Fatigue?: No  Skin Skin rash/lesions?: No Itching?: Yes  Eyes Blurred vision?: No Double vision?: Yes  Ears/Nose/Throat Sore throat?: No Sinus problems?: Yes  Hematologic/Lymphatic Swollen glands?: No Easy bruising?: Yes  Cardiovascular Leg swelling?: Yes Chest pain?: No  Respiratory Cough?: Yes Shortness of breath?: No  Endocrine Excessive thirst?: No  Musculoskeletal Back pain?: Yes Joint pain?: No  Neurological Headaches?: No Dizziness?: Yes  Psychologic Depression?: No Anxiety?: No  Physical Exam: BP (!) 194/90   Pulse 76   Ht 6' (1.829 m)   Wt 249 lb 14.4 oz (113.4 kg)   BMI 33.89 kg/m   Constitutional: Well nourished. Alert  and oriented, No acute distress. HEENT: Welcome AT, moist mucus membranes. Trachea midline, no masses. Cardiovascular: No clubbing, cyanosis, or edema. Respiratory: Normal respiratory effort, no increased work of breathing. GI: Abdomen is soft, non tender, non distended, no abdominal masses. Liver and spleen not palpable.  No hernias appreciated.  Stool sample for occult testing is not indicated.   GU: No CVA tenderness.  No bladder fullness or masses.  Patient with circumcised phallus.   Urethral meatus is patent.  No penile discharge. No penile lesions or rashes. Scrotum without lesions, cysts, rashes and/or edema.  Testicles are located scrotally bilaterally. No masses are appreciated in the testicles. Left and right epididymis are normal. Rectal: Patient with  normal sphincter tone. Anus and perineum without scarring or rashes. No rectal masses are appreciated. Prostate is approximately  50 grams, no nodules are appreciated. Seminal vesicles are normal. Skin: No rashes, bruises or suspicious lesions. Lymph: No cervical or inguinal adenopathy. Neurologic: Grossly intact, no focal deficits, moving all 4 extremities. Psychiatric: Normal mood and affect.  Laboratory Data:  Lab Results  Component Value Date   CREATININE 1.36 (H) 11/27/2015     Urinalysis 3-10 RBC's/hpf.  See EPIC.    Pertinent Imaging: Results for RUDYARD, MCNEMAR (MRN [id number]) as of 08/16/2016 15:40  Ref. Range 08/16/2016 15:39  Scan Result Unknown 3ml    Assessment & Plan:    1. Gross hematuria   I explained to the patient that there are a number of causes that can be associated with blood in the urine, such as stones, BPH, UTI's, damage to the urinary tract and/or cancer.  - At this time, I felt that the patient warranted further urologic evaluation.   The AUA guidelines state that a CT urogram is the preferred imaging study to evaluate hematuria.  - I explained to the patient that a contrast material will be  injected into a vein and that in rare instances, an allergic reaction can result and may even life threatening   The patient denies any allergies to contrast, iodine and/or seafood and is  taking metformin.  - Following the imaging study,  I've recommended a cystoscopy. I described how this is performed, typically in an office setting with a flexible cystoscope. We described the risks, benefits, and possible side effects, the most common of which is a minor amount of blood in the urine and/or burning which usually resolves in 24 to 48 hours.    - The patient had the opportunity to ask questions which were answered. Based upon this discussion, the patient is willing to proceed. Therefore, I've ordered: a CT Urogram and cystoscopy.  - He will return following all of the above for discussion of the results.     - Urinalysis, Complete  - CULTURE, URINE COMPREHENSIVE  - BUN+Creat  2. BPH with LUTS  - Continue conservative management, avoiding bladder irritants and timed voiding's  - PVR was 95 mL  - Initiate 5 alpha reductase inhibitor (finateride 5 mg daily), discussed side effects  - Continue tamsulosin 0.4 mg daily  - RTC in 3 months for IPSS, PVR and exam    Return for CT Urogram report and cystoscopy.  These notes generated with voice recognition software. I apologize for typographical errors.  Zara Council, [facility] [address] [place], [place] [address] [phone]

## 2016-08-17 LAB — BUN+CREAT
BUN/Creatinine Ratio: 17 (ref 10–24)
BUN: 23 mg/dL (ref 8–27)
Creatinine, Ser: 1.36 mg/dL — ABNORMAL HIGH (ref 0.76–1.27)
GFR, EST AFRICAN AMERICAN: 54 mL/min/{1.73_m2} — AB (ref >59)
GFR, EST NON AFRICAN AMERICAN: 46 mL/min/{1.73_m2} — AB (ref >59)

## 2016-08-20 LAB — CULTURE, URINE COMPREHENSIVE

## 2016-08-31 ENCOUNTER — Ambulatory Visit
Admission: RE | Admit: 2016-08-31 | Discharge: 2016-08-31 | Disposition: A | Discharge status: Home / Self Care | Payer: PPO | Source: Ambulatory Visit | Attending: Urology | Admitting: Urology

## 2016-08-31 DIAGNOSIS — D3502 Benign neoplasm of left adrenal gland: Secondary | ICD-10-CM | POA: Diagnosis not present

## 2016-08-31 DIAGNOSIS — I2510 Atherosclerotic heart disease of native coronary artery without angina pectoris: Secondary | ICD-10-CM | POA: Diagnosis not present

## 2016-08-31 DIAGNOSIS — N400 Enlarged prostate without lower urinary tract symptoms: Secondary | ICD-10-CM | POA: Insufficient documentation

## 2016-08-31 DIAGNOSIS — K449 Diaphragmatic hernia without obstruction or gangrene: Secondary | ICD-10-CM | POA: Insufficient documentation

## 2016-08-31 DIAGNOSIS — R310 Gross hematuria: Secondary | ICD-10-CM | POA: Insufficient documentation

## 2016-08-31 DIAGNOSIS — N281 Cyst of kidney, acquired: Secondary | ICD-10-CM | POA: Insufficient documentation

## 2016-08-31 DIAGNOSIS — K429 Umbilical hernia without obstruction or gangrene: Secondary | ICD-10-CM | POA: Insufficient documentation

## 2016-08-31 DIAGNOSIS — I700 Atherosclerosis of aorta: Secondary | ICD-10-CM | POA: Insufficient documentation

## 2016-08-31 MED ORDER — IOPAMIDOL (ISOVUE-300) INJECTION 61%
125.0000 mL | Freq: Once | INTRAVENOUS | Status: AC | PRN
  Administered 2016-08-31: 125 mL via INTRAVENOUS

## 2016-09-02 ENCOUNTER — Ambulatory Visit (INDEPENDENT_AMBULATORY_CARE_PROVIDER_SITE_OTHER): Payer: PPO | Admitting: Urology

## 2016-09-02 ENCOUNTER — Encounter: Payer: Self-pay | Admitting: Urology

## 2016-09-02 VITALS — BP 146/69 | HR 59 | Ht 72.0 in | Wt 247.0 lb

## 2016-09-02 DIAGNOSIS — S82891A Other fracture of right lower leg, initial encounter for closed fracture: Secondary | ICD-10-CM | POA: Insufficient documentation

## 2016-09-02 DIAGNOSIS — K859 Acute pancreatitis without necrosis or infection, unspecified: Secondary | ICD-10-CM | POA: Insufficient documentation

## 2016-09-02 DIAGNOSIS — E785 Hyperlipidemia, unspecified: Secondary | ICD-10-CM | POA: Insufficient documentation

## 2016-09-02 DIAGNOSIS — R569 Unspecified convulsions: Secondary | ICD-10-CM | POA: Insufficient documentation

## 2016-09-02 DIAGNOSIS — N400 Enlarged prostate without lower urinary tract symptoms: Secondary | ICD-10-CM | POA: Insufficient documentation

## 2016-09-02 DIAGNOSIS — M545 Low back pain, unspecified: Secondary | ICD-10-CM | POA: Insufficient documentation

## 2016-09-02 DIAGNOSIS — I208 Other forms of angina pectoris: Secondary | ICD-10-CM | POA: Insufficient documentation

## 2016-09-02 DIAGNOSIS — I429 Cardiomyopathy, unspecified: Secondary | ICD-10-CM | POA: Insufficient documentation

## 2016-09-02 DIAGNOSIS — I2510 Atherosclerotic heart disease of native coronary artery without angina pectoris: Secondary | ICD-10-CM | POA: Insufficient documentation

## 2016-09-02 DIAGNOSIS — I2089 Other forms of angina pectoris: Secondary | ICD-10-CM | POA: Insufficient documentation

## 2016-09-02 DIAGNOSIS — I10 Essential (primary) hypertension: Secondary | ICD-10-CM | POA: Insufficient documentation

## 2016-09-02 DIAGNOSIS — D126 Benign neoplasm of colon, unspecified: Secondary | ICD-10-CM | POA: Insufficient documentation

## 2016-09-02 DIAGNOSIS — E669 Obesity, unspecified: Secondary | ICD-10-CM | POA: Insufficient documentation

## 2016-09-02 DIAGNOSIS — G510 Bell's palsy: Secondary | ICD-10-CM | POA: Insufficient documentation

## 2016-09-02 DIAGNOSIS — D490 Neoplasm of unspecified behavior of digestive system: Secondary | ICD-10-CM | POA: Insufficient documentation

## 2016-09-02 DIAGNOSIS — E119 Type 2 diabetes mellitus without complications: Secondary | ICD-10-CM | POA: Insufficient documentation

## 2016-09-02 DIAGNOSIS — R310 Gross hematuria: Secondary | ICD-10-CM

## 2016-09-02 DIAGNOSIS — K4090 Unilateral inguinal hernia, without obstruction or gangrene, not specified as recurrent: Secondary | ICD-10-CM | POA: Insufficient documentation

## 2016-09-02 DIAGNOSIS — I878 Other specified disorders of veins: Secondary | ICD-10-CM | POA: Insufficient documentation

## 2016-09-02 DIAGNOSIS — G4730 Sleep apnea, unspecified: Secondary | ICD-10-CM | POA: Insufficient documentation

## 2016-09-02 MED ORDER — LIDOCAINE HCL 2 % EX GEL
1.0000 "application " | Freq: Once | CUTANEOUS | Status: AC
  Administered 2016-09-02: 1 via URETHRAL

## 2016-09-02 MED ORDER — CIPROFLOXACIN HCL 500 MG PO TABS
500.0000 mg | ORAL_TABLET | Freq: Once | ORAL | Status: AC
  Administered 2016-09-02: 500 mg via ORAL

## 2016-09-02 NOTE — Addendum Note (Signed)
Addended by: Wilson Singer on: 09/02/2016 04:45 PM   Modules accepted: Orders

## 2016-09-02 NOTE — Progress Notes (Signed)
   09/02/16  CC:  Chief Complaint  Patient presents with  . Cysto    gross hematuria     HPI: The patient is a 82 year old gentleman presents today for follow-up and completion of his gross hematuria workup.The patient stated he has been having gross hematuria intermittently since June 2017.  Antibiotics were given and the hematuria cleared, but then returned.  He has one documented UTI in 04/08/2016.    CT Urogram negative for source.  Recently started on finasteride for symptomatic BPH. Also on floamx.  There were no vitals taken for this visit. NED. A&Ox3.   No respiratory distress   Abd soft, NT, ND Normal phallus with bilateral descended testicles  Cystoscopy Procedure Note  Patient identification was confirmed, informed consent was obtained, and patient was prepped using Betadine solution.  Lidocaine jelly was administered per urethral meatus.    Preoperative abx where received prior to procedure.     Pre-Procedure: - Inspection reveals a normal caliber ureteral meatus.  Procedure: The flexible cystoscope was introduced without difficulty - No urethral strictures/lesions are present. - Enlarged prostate  Hypervascular prostate, visually obstructive - Normal bladder neck - Bilateral ureteral orifices identified - Bladder mucosa  reveals no ulcers, tumors, or lesions - No bladder stones - No trabeculation  Retroflexion shows no intravesical prostate lobe.   Post-Procedure: - Patient tolerated the procedure well  Assessment/ Plan:  1. Gross hematuria -negative workup except for hypervascular prostate which is the likely source. The patient was just started on finasteride for BPH which should help decrease the size of the hypervascular vessels on his prostate.  2. BPH -continue flomax and finasteride. Follow up in 3 months for symptom check.

## 2016-09-30 DIAGNOSIS — B351 Tinea unguium: Secondary | ICD-10-CM | POA: Diagnosis not present

## 2016-09-30 DIAGNOSIS — E1140 Type 2 diabetes mellitus with diabetic neuropathy, unspecified: Secondary | ICD-10-CM | POA: Diagnosis not present

## 2016-09-30 DIAGNOSIS — L851 Acquired keratosis [keratoderma] palmaris et plantaris: Secondary | ICD-10-CM | POA: Diagnosis not present

## 2016-10-07 ENCOUNTER — Ambulatory Visit: Payer: PPO | Admitting: Urology

## 2016-10-07 ENCOUNTER — Encounter: Payer: Self-pay | Admitting: Urology

## 2016-10-07 ENCOUNTER — Other Ambulatory Visit (INDEPENDENT_AMBULATORY_CARE_PROVIDER_SITE_OTHER): Payer: Self-pay | Admitting: Vascular Surgery

## 2016-10-07 VITALS — BP 136/59 | HR 65 | Ht 72.0 in | Wt 249.1 lb

## 2016-10-07 DIAGNOSIS — R310 Gross hematuria: Secondary | ICD-10-CM

## 2016-10-07 DIAGNOSIS — N401 Enlarged prostate with lower urinary tract symptoms: Secondary | ICD-10-CM

## 2016-10-07 DIAGNOSIS — N138 Other obstructive and reflux uropathy: Secondary | ICD-10-CM | POA: Diagnosis not present

## 2016-10-07 DIAGNOSIS — I739 Peripheral vascular disease, unspecified: Secondary | ICD-10-CM

## 2016-10-07 LAB — URINALYSIS, COMPLETE
BILIRUBIN UA: NEGATIVE
GLUCOSE, UA: NEGATIVE
KETONES UA: NEGATIVE
Leukocytes, UA: NEGATIVE
NITRITE UA: NEGATIVE
SPEC GRAV UA: 1.01 (ref 1.005–1.030)
UUROB: 0.2 mg/dL (ref 0.2–1.0)
pH, UA: 5.5 (ref 5.0–7.5)

## 2016-10-07 LAB — MICROSCOPIC EXAMINATION
Bacteria, UA: NONE SEEN
Epithelial Cells (non renal): NONE SEEN /hpf (ref 0–10)
WBC UA: NONE SEEN /HPF (ref 0–?)

## 2016-10-07 NOTE — Progress Notes (Signed)
10/07/2016 11:36 AM   Justin Hancock 11-21-1926 FL:[id number]  Referring provider: Maryland Pink, MD [address] [facility], [place] [address]  Chief Complaint  Patient presents with  . Hematuria    gross x 3 today    HPI: Patient is an 83 year old Caucasian male who presents today requesting an urgent appointment for gross hematuria  Patient states that he had three episodes of gross hematuria earlier today.  He has been having frequency, urgency, nocturia, incontinence and intermittency, but these are baseline symptoms.  He states that he has just recovered from the flu.  He was having fevers, chills, weakness and diarrhea.  He has not had symptoms since Monday.    He is not having dysuria, suprapubic pain or back pain.  He has not had recent fevers, chills, nausea or vomiting.    He completed a hematuria work up in 08/2016 with CT Urogram and cystoscopy.  No worrisome GU findings were seen on CT.  He was found to have a hypervascular prostate on cystoscopy.  He was recently started on finasteride in 07/2016.    His UA today was positive for > 30 RBC's.    Patient takes ASA on a prn basis.  He was taking quite a bit while he had the flu.  He is not taking the ASA at this time.    PMH: Past Medical History:  Diagnosis Date  . Arthritis   . Cancer ([place])   . Chronic kidney disease   . Coronary artery disease   . Diabetes mellitus without complication ([place])   . Heart murmur   . Heartburn   . HLD (hyperlipidemia)   . Hypertension   . Peripheral vascular disease ([place])   . Sleep apnea     Surgical History: Past Surgical History:  Procedure Laterality Date  . colon cancer surgery     x 2  . PERIPHERAL VASCULAR CATHETERIZATION N/A 11/27/2015   Procedure: Abdominal Aortogram w/Lower Extremity;  Surgeon: Algernon Huxley, MD;  Location: [facility];  Service: Cardiovascular;  Laterality: N/A;  . PERIPHERAL VASCULAR CATHETERIZATION  11/27/2015   Procedure:  Lower Extremity Intervention;  Surgeon: Algernon Huxley, MD;  Location: [facility];  Service: Cardiovascular;;  . TONSILLECTOMY      Home Medications:  Allergies as of 10/07/2016      Reactions   Penicillins       Medication List       Accurate as of 10/07/16 11:36 AM. Always use your most recent med list.          amLODipine 5 MG tablet Commonly known as:  NORVASC Take 5 mg by mouth daily.   aspirin 325 MG EC tablet Take 325 mg by mouth 2 (two) times daily.   clopidogrel 75 MG tablet Commonly known as:  PLAVIX Take 75 mg by mouth daily.   finasteride 5 MG tablet Commonly known as:  PROSCAR Take 1 tablet (5 mg total) by mouth daily.   furosemide 20 MG tablet Commonly known as:  LASIX Take by mouth.   hydrALAZINE 25 MG tablet Commonly known as:  APRESOLINE Take 75 mg by mouth 2 (two) times daily.   isosorbide mononitrate 60 MG 24 hr tablet Commonly known as:  IMDUR Take 60 mg by mouth daily.   lisinopril 20 MG tablet Commonly known as:  PRINIVIL,ZESTRIL TAKE ONE TABLET BY MOUTH TWICE A DAY   lovastatin 20 MG tablet Commonly known as:  MEVACOR Take 20 mg by mouth 2 (two) times daily.   metFORMIN 500 MG tablet Commonly known as:  GLUCOPHAGE Take 500 mg by mouth 2 (two) times daily with a meal.   PRESERVISION AREDS 2+MULTI VIT PO Take by mouth.   tamsulosin 0.4 MG Caps capsule Commonly known as:  FLOMAX Take by mouth.   triamcinolone cream 0.1 % Commonly known as:  KENALOG Apply topically.   vitamin C 500 MG tablet Commonly known as:  ASCORBIC ACID Take by mouth.       Allergies:  Allergies  Allergen Reactions  . Penicillins     Family History: Family History  Problem Relation Age of Onset  . Kidney Stones Father   . Prostate cancer Neg Hx   . Kidney disease Neg Hx   . Bladder Cancer Neg Hx     Social History:  reports that he quit smoking about 63 years ago. He has never used smokeless tobacco. He reports that he does not  drink alcohol or use drugs.  ROS: UROLOGY Frequent Urination?: Yes Hard to postpone urination?: Yes Burning/pain with urination?: No Get up at night to urinate?: Yes Leakage of urine?: Yes Urine stream starts and stops?: Yes Trouble starting stream?: No Do you have to strain to urinate?: No Blood in urine?: Yes Urinary tract infection?: No Sexually transmitted disease?: No Injury to kidneys or bladder?: No Painful intercourse?: No Weak stream?: No Erection problems?: Yes Penile pain?: No  Gastrointestinal Nausea?: No Vomiting?: No Indigestion/heartburn?: No Diarrhea?: Yes Constipation?: No  Constitutional Fever: No Night sweats?: No Weight loss?: No Fatigue?: No  Skin Skin rash/lesions?: No Itching?: No  Eyes Blurred vision?: Yes Double vision?: No  Ears/Nose/Throat Sore throat?: No Sinus problems?: No  Hematologic/Lymphatic Swollen glands?: No Easy bruising?: No  Cardiovascular Leg swelling?: No Chest pain?: No  Respiratory Cough?: No Shortness of breath?: No  Endocrine Excessive thirst?: No  Musculoskeletal Back pain?: No Joint pain?: Yes  Neurological Headaches?: No Dizziness?: No  Psychologic Depression?: No Anxiety?: No  Physical Exam: BP (!) 136/59   Pulse 65   Ht 6' (1.829 m)   Wt 249 lb 1.6 oz (113 kg)   BMI 33.78 kg/m   Constitutional: Well nourished. Alert and oriented, No acute distress. HEENT: [place] AT, moist mucus membranes. Trachea midline, no masses. Cardiovascular: No clubbing, cyanosis, or edema. Respiratory: Normal respiratory effort, no increased work of breathing. GI: Abdomen is soft, non tender, non distended, no abdominal masses. Liver and spleen not palpable.  No hernias appreciated.  Stool sample for occult testing is not indicated.   GU: No CVA tenderness.  No bladder fullness or masses.   Skin: No rashes, bruises or suspicious lesions. Lymph: No cervical or inguinal adenopathy. Neurologic: Grossly intact,  no focal deficits, moving all 4 extremities. Psychiatric: Normal mood and affect.  Laboratory Data:  Lab Results  Component Value Date   CREATININE 1.36 (H) 08/16/2016    Urinalysis > 30 RBC's.  See EPIC.    Pertinent Imaging: CLINICAL DATA:  Multiple episodes of gross hematuria. Remote history of colon cancer surgery x 2. Cholecystectomy. Hernia repair.  EXAM: CT ABDOMEN AND PELVIS WITHOUT AND WITH CONTRAST  TECHNIQUE: Multidetector CT imaging of the abdomen and pelvis was performed following the standard protocol before and following the bolus administration of intravenous contrast.  CONTRAST:  165mL ISOVUE-300 IOPAMIDOL (ISOVUE-300) INJECTION 61%  COMPARISON:  12/02/2010 CT abdomen/ pelvis.  FINDINGS: Lower chest: Right lower lobe 5 mm pulmonary nodule (series 8/ image 3)  and 5 mm medial left lower lobe pulmonary nodule (series 8/image 6) are stable back to 08/18/2010 and considered benign. Left main and 3 vessel coronary atherosclerosis.  Hepatobiliary: Normal liver with no liver mass. Cholecystectomy. Mild intrahepatic biliary ductal dilatation and common bile duct diameter 7 mm, within normal post cholecystectomy limits. No radiopaque choledocholithiasis.  Pancreas: There are cystic pancreatic head lesions measuring 2.3 cm and 1.3 cm (series 4/images 31 and 35), both stable since 08/18/2010, consistent with benign lesions, probably small pseudocysts. There is stable chronic mild dilatation of the ventral pancreatic duct (5 mm). Main pancreatic duct is normal caliber. Scattered calcifications in the pancreas are probably vascular.  Spleen: Normal size. No mass.  Adrenals/Urinary Tract: No discrete right adrenal nodule. Left adrenal 1.6 cm adenoma is stable back to 08/18/2010. No renal stones. No hydronephrosis. Normal caliber ureters, with no ureteral stones. Exophytic simple 5.6 cm anterior interpolar right renal cyst. Partially exophytic simple 5.0  cm posterior interpolar right renal cyst. Exophytic simple 1.9 cm posterior lower left renal cysts. On delayed imaging, there is no urothelial wall thickening and there are no filling defects in the opacified portions of the bilateral collecting systems or ureters, noting non opacification of portions of the proximal lumbar segment of the right ureter, limiting evaluation in these locations. No bladder stones, bladder wall thickening, bladder mass or bladder diverticulum.  Stomach/Bowel: Small hiatal hernia. Otherwise grossly normal stomach. Status post subtotal right hemicolectomy with intact appearing ileocolic anastomosis in the right abdomen. There a few mildly dilated proximal small bowel loops with air-fluid levels, measuring up to 3.4 cm diameter. No discrete small bowel caliber transition. No small bowel wall thickening. No anastomotic or large bowel wall thickening or pericolonic fat stranding.  Vascular/Lymphatic: Atherosclerotic nonaneurysmal abdominal aorta. Patent portal, splenic, hepatic and renal veins. No pathologically enlarged lymph nodes in the abdomen or pelvis.  Reproductive: Mildly enlarged prostate. Prostate dimensions 3.9 x 3.9 x 6.1 cm (volume = 49 cm^3).  Other: No pneumoperitoneum, ascites or focal fluid collection. New small fat containing umbilical hernia.  Musculoskeletal: No aggressive appearing focal osseous lesions. Marked thoracolumbar spondylosis.  IMPRESSION: 1. No urolithiasis.  No hydronephrosis. 2. Simple renal cysts. No suspicious renal cortical masses. No urothelial lesions, with limitations as described. 3. Mildly enlarged prostate. 4. No evidence of local anastomotic tumor recurrence in the colon. No findings of metastatic disease in the abdomen or pelvis. 5. Mildly dilated proximal small bowel loops with air-fluid levels. No small bowel wall thickening or discrete caliber transition. Mild adynamic ileus is favored. Clinical  correlation is necessary. 6. Additional findings include aortic atherosclerosis, left main and 3 vessel coronary atherosclerosis, new small fat containing umbilical hernia, small hiatal hernia stable left adrenal adenoma.   Electronically Signed   By: Ilona Sorrel M.D.   On: 08/31/2016 17:06   Assessment & Plan:    1. Gross hematuria  - completed a hematuria workup in 08/2016 and was found to have a hypervascular prostate  - UA is positive for > 30 RBC's  - urine sent for culture, will wait for culture results to prescribe an antibiotic as patient is not having other symptoms of an UTI at this time  - patient will not take any ASA until culture is available  - Advised to contact our office or seek treatment in the ED if becomes febrile or pain/ vomiting are difficult control or blood in urine persists or is bright red and thick or experiences SOB, CP or light headedness in  order to arrange for emergent/urgent intervention  2. . BPH with LUTS             - Continue conservative management, avoiding bladder irritants and timed voiding's             - Continue tamsulosin 0.4 mg daily and finasteride daily 5 mg daily             - RTC in 3 months for IPSS, PVR and exam   Return for keep appointment in March 2018.  These notes generated with voice recognition software. I apologize for typographical errors.  Zara Council, [facility] [address] [place], [place] [address] [phone]

## 2016-10-08 ENCOUNTER — Encounter (INDEPENDENT_AMBULATORY_CARE_PROVIDER_SITE_OTHER): Payer: PPO

## 2016-10-08 ENCOUNTER — Ambulatory Visit (INDEPENDENT_AMBULATORY_CARE_PROVIDER_SITE_OTHER): Payer: Self-pay | Admitting: Vascular Surgery

## 2016-10-08 ENCOUNTER — Telehealth: Payer: Self-pay

## 2016-10-08 NOTE — Telephone Encounter (Signed)
Pt called stating he was supposed to call you about his night last night. Pt stated that his urine was cloudy yellow in office yesterday and last night it went to clear yellow. Pt stated that this morning urine is clear as water.

## 2016-10-10 LAB — CULTURE, URINE COMPREHENSIVE

## 2016-10-12 ENCOUNTER — Telehealth: Payer: Self-pay

## 2016-10-12 NOTE — Telephone Encounter (Signed)
Spoke with pt in reference to -ucx and keeping f/u appt. Pt voiced understanding.

## 2016-10-12 NOTE — Telephone Encounter (Signed)
-----   Message from Nori Riis, PA-C sent at 10/10/2016  6:01 PM EST ----- Please notify the patient that his urine culture was negative and to keep his appointment in March.

## 2016-11-01 DIAGNOSIS — H353132 Nonexudative age-related macular degeneration, bilateral, intermediate dry stage: Secondary | ICD-10-CM | POA: Diagnosis not present

## 2016-11-01 DIAGNOSIS — H2513 Age-related nuclear cataract, bilateral: Secondary | ICD-10-CM | POA: Diagnosis not present

## 2016-11-08 DIAGNOSIS — M1990 Unspecified osteoarthritis, unspecified site: Secondary | ICD-10-CM | POA: Diagnosis not present

## 2016-11-08 DIAGNOSIS — I209 Angina pectoris, unspecified: Secondary | ICD-10-CM | POA: Diagnosis not present

## 2016-11-08 DIAGNOSIS — E784 Other hyperlipidemia: Secondary | ICD-10-CM | POA: Diagnosis not present

## 2016-11-08 DIAGNOSIS — L97919 Non-pressure chronic ulcer of unspecified part of right lower leg with unspecified severity: Secondary | ICD-10-CM | POA: Diagnosis not present

## 2016-11-08 DIAGNOSIS — I5022 Chronic systolic (congestive) heart failure: Secondary | ICD-10-CM | POA: Diagnosis not present

## 2016-11-08 DIAGNOSIS — I2510 Atherosclerotic heart disease of native coronary artery without angina pectoris: Secondary | ICD-10-CM | POA: Diagnosis not present

## 2016-11-08 DIAGNOSIS — R0602 Shortness of breath: Secondary | ICD-10-CM | POA: Diagnosis not present

## 2016-11-08 DIAGNOSIS — I493 Ventricular premature depolarization: Secondary | ICD-10-CM | POA: Diagnosis not present

## 2016-11-08 DIAGNOSIS — E669 Obesity, unspecified: Secondary | ICD-10-CM | POA: Diagnosis not present

## 2016-11-08 DIAGNOSIS — E119 Type 2 diabetes mellitus without complications: Secondary | ICD-10-CM | POA: Diagnosis not present

## 2016-11-08 DIAGNOSIS — N182 Chronic kidney disease, stage 2 (mild): Secondary | ICD-10-CM | POA: Diagnosis not present

## 2016-11-08 DIAGNOSIS — G4733 Obstructive sleep apnea (adult) (pediatric): Secondary | ICD-10-CM | POA: Diagnosis not present

## 2016-11-11 ENCOUNTER — Encounter (INDEPENDENT_AMBULATORY_CARE_PROVIDER_SITE_OTHER): Payer: Self-pay | Admitting: Vascular Surgery

## 2016-11-11 DIAGNOSIS — H2513 Age-related nuclear cataract, bilateral: Secondary | ICD-10-CM | POA: Diagnosis not present

## 2016-11-12 DIAGNOSIS — S51811A Laceration without foreign body of right forearm, initial encounter: Secondary | ICD-10-CM | POA: Diagnosis not present

## 2016-11-18 NOTE — Discharge Instructions (Signed)
Cataract Surgery, Care After °Refer to this sheet in the next few weeks. These instructions provide you with information about caring for yourself after your procedure. Your health care provider may also give you more specific instructions. Your treatment has been planned according to current medical practices, but problems sometimes occur. Call your health care provider if you have any problems or questions after your procedure. °What can I expect after the procedure? °After the procedure, it is common to have: °· Itching. °· Discomfort. °· Fluid discharge. °· Sensitivity to light and to touch. °· Bruising. °Follow these instructions at home: °Eye Care  °· Check your eye every day for signs of infection. Watch for: °¨ Redness, swelling, or pain. °¨ Fluid, blood, or pus. °¨ Warmth. °¨ Bad smell. °Activity  °· Avoid strenuous activities, such as playing contact sports, for as long as told by your health care provider. °· Do not drive or operate heavy machinery until your health care provider approves. °· Do not bend or lift heavy objects . Bending increases pressure in the eye. You can walk, climb stairs, and do light household chores. °· Ask your health care provider when you can return to work. If you work in a dusty environment, you may be advised to wear protective eyewear for a period of time. °General instructions  °· Take or apply over-the-counter and prescription medicines only as told by your health care provider. This includes eye drops. °· Do not touch or rub your eyes. °· If you were given a protective shield, wear it as told by your health care provider. If you were not given a protective shield, wear sunglasses as told by your health care provider to protect your eyes. °· Keep the area around your eye clean and dry. Avoid swimming or allowing water to hit you directly in the face while showering until told by your health care provider. Keep soap and shampoo out of your eyes. °· Do not put a contact lens  into the affected eye or eyes until your health care provider approves. °· Keep all follow-up visits as told by your health care provider. This is important. °Contact a health care provider if: ° °· You have increased bruising around your eye. °· You have pain that is not helped with medicine. °· You have a fever. °· You have redness, swelling, or pain in your eye. °· You have fluid, blood, or pus coming from your incision. °· Your vision gets worse. °Get help right away if: °· You have sudden vision loss. °This information is not intended to replace advice given to you by your health care provider. Make sure you discuss any questions you have with your health care provider. °Document Released: 04/02/2005 Document Revised: 01/22/2016 Document Reviewed: 07/24/2015 °Elsevier Interactive Patient Education © 2017 [employer] ° ° ° ° °General Anesthesia, Adult, Care After °These instructions provide you with information about caring for yourself after your procedure. Your health care provider may also give you more specific instructions. Your treatment has been planned according to current medical practices, but problems sometimes occur. Call your health care provider if you have any problems or questions after your procedure. °What can I expect after the procedure? °After the procedure, it is common to have: °· Vomiting. °· A sore throat. °· Mental slowness. °It is common to feel: °· Nauseous. °· Cold or shivery. °· Sleepy. °· Tired. °· Sore or achy, even in parts of your body where you did not have surgery. °Follow these instructions at   home: °For at least 24 hours after the procedure:  °· Do not: °¨ Participate in activities where you could fall or become injured. °¨ Drive. °¨ Use heavy machinery. °¨ Drink alcohol. °¨ Take sleeping pills or medicines that cause drowsiness. °¨ Make important decisions or sign legal documents. °¨ Take care of children on your own. °· Rest. °Eating and drinking  °· If you vomit, drink  water, juice, or soup when you can drink without vomiting. °· Drink enough fluid to keep your urine clear or pale yellow. °· Make sure you have little or no nausea before eating solid foods. °· Follow the diet recommended by your health care provider. °General instructions  °· Have a responsible adult stay with you until you are awake and alert. °· Return to your normal activities as told by your health care provider. Ask your health care provider what activities are safe for you. °· Take over-the-counter and prescription medicines only as told by your health care provider. °· If you smoke, do not smoke without supervision. °· Keep all follow-up visits as told by your health care provider. This is important. °Contact a health care provider if: °· You continue to have nausea or vomiting at home, and medicines are not helpful. °· You cannot drink fluids or start eating again. °· You cannot urinate after 8-12 hours. °· You develop a skin rash. °· You have fever. °· You have increasing redness at the site of your procedure. °Get help right away if: °· You have difficulty breathing. °· You have chest pain. °· You have unexpected bleeding. °· You feel that you are having a life-threatening or urgent problem. °This information is not intended to replace advice given to you by your health care provider. Make sure you discuss any questions you have with your health care provider. °Document Released: 12/20/2000 Document Revised: 02/16/2016 Document Reviewed: 08/28/2015 °Elsevier Interactive Patient Education © 2017 [employer] ° °

## 2016-11-22 ENCOUNTER — Encounter: Payer: Self-pay | Admitting: *Deleted

## 2016-11-23 ENCOUNTER — Encounter: Payer: PPO | Attending: Internal Medicine | Admitting: Internal Medicine

## 2016-11-23 DIAGNOSIS — E1142 Type 2 diabetes mellitus with diabetic polyneuropathy: Secondary | ICD-10-CM | POA: Insufficient documentation

## 2016-11-23 DIAGNOSIS — Z87891 Personal history of nicotine dependence: Secondary | ICD-10-CM | POA: Diagnosis not present

## 2016-11-23 DIAGNOSIS — Z880 Allergy status to penicillin: Secondary | ICD-10-CM | POA: Insufficient documentation

## 2016-11-23 DIAGNOSIS — Z85038 Personal history of other malignant neoplasm of large intestine: Secondary | ICD-10-CM | POA: Diagnosis not present

## 2016-11-23 DIAGNOSIS — S51811D Laceration without foreign body of right forearm, subsequent encounter: Secondary | ICD-10-CM | POA: Diagnosis not present

## 2016-11-23 DIAGNOSIS — I429 Cardiomyopathy, unspecified: Secondary | ICD-10-CM | POA: Insufficient documentation

## 2016-11-23 DIAGNOSIS — I87331 Chronic venous hypertension (idiopathic) with ulcer and inflammation of right lower extremity: Secondary | ICD-10-CM | POA: Insufficient documentation

## 2016-11-23 DIAGNOSIS — L97211 Non-pressure chronic ulcer of right calf limited to breakdown of skin: Secondary | ICD-10-CM | POA: Diagnosis not present

## 2016-11-23 DIAGNOSIS — X58XXXD Exposure to other specified factors, subsequent encounter: Secondary | ICD-10-CM | POA: Insufficient documentation

## 2016-11-23 DIAGNOSIS — Z7984 Long term (current) use of oral hypoglycemic drugs: Secondary | ICD-10-CM | POA: Insufficient documentation

## 2016-11-23 DIAGNOSIS — Z955 Presence of coronary angioplasty implant and graft: Secondary | ICD-10-CM | POA: Insufficient documentation

## 2016-11-23 DIAGNOSIS — G4730 Sleep apnea, unspecified: Secondary | ICD-10-CM | POA: Insufficient documentation

## 2016-11-23 DIAGNOSIS — J449 Chronic obstructive pulmonary disease, unspecified: Secondary | ICD-10-CM | POA: Diagnosis not present

## 2016-11-23 DIAGNOSIS — I872 Venous insufficiency (chronic) (peripheral): Secondary | ICD-10-CM | POA: Diagnosis not present

## 2016-11-23 DIAGNOSIS — N400 Enlarged prostate without lower urinary tract symptoms: Secondary | ICD-10-CM | POA: Diagnosis not present

## 2016-11-23 DIAGNOSIS — S51001A Unspecified open wound of right elbow, initial encounter: Secondary | ICD-10-CM | POA: Diagnosis not present

## 2016-11-23 DIAGNOSIS — E11621 Type 2 diabetes mellitus with foot ulcer: Secondary | ICD-10-CM | POA: Diagnosis not present

## 2016-11-23 DIAGNOSIS — I10 Essential (primary) hypertension: Secondary | ICD-10-CM | POA: Diagnosis not present

## 2016-11-23 DIAGNOSIS — I2510 Atherosclerotic heart disease of native coronary artery without angina pectoris: Secondary | ICD-10-CM | POA: Diagnosis not present

## 2016-11-24 ENCOUNTER — Ambulatory Visit: Payer: PPO | Admitting: Anesthesiology

## 2016-11-24 ENCOUNTER — Encounter
Admission: RE | Disposition: A | Discharge status: Home / Self Care | Payer: Self-pay | Source: Ambulatory Visit | Attending: Ophthalmology

## 2016-11-24 ENCOUNTER — Ambulatory Visit
Admission: RE | Admit: 2016-11-24 | Discharge: 2016-11-24 | Disposition: A | Discharge status: Home / Self Care | Payer: PPO | Source: Ambulatory Visit | Attending: Ophthalmology | Admitting: Ophthalmology

## 2016-11-24 DIAGNOSIS — E1136 Type 2 diabetes mellitus with diabetic cataract: Secondary | ICD-10-CM | POA: Diagnosis not present

## 2016-11-24 DIAGNOSIS — Z87891 Personal history of nicotine dependence: Secondary | ICD-10-CM | POA: Diagnosis not present

## 2016-11-24 DIAGNOSIS — N289 Disorder of kidney and ureter, unspecified: Secondary | ICD-10-CM | POA: Diagnosis not present

## 2016-11-24 DIAGNOSIS — H2511 Age-related nuclear cataract, right eye: Secondary | ICD-10-CM | POA: Diagnosis not present

## 2016-11-24 DIAGNOSIS — I2510 Atherosclerotic heart disease of native coronary artery without angina pectoris: Secondary | ICD-10-CM | POA: Insufficient documentation

## 2016-11-24 DIAGNOSIS — H2513 Age-related nuclear cataract, bilateral: Secondary | ICD-10-CM | POA: Diagnosis not present

## 2016-11-24 DIAGNOSIS — G4730 Sleep apnea, unspecified: Secondary | ICD-10-CM | POA: Insufficient documentation

## 2016-11-24 DIAGNOSIS — I10 Essential (primary) hypertension: Secondary | ICD-10-CM | POA: Insufficient documentation

## 2016-11-24 HISTORY — DX: Other abnormalities of gait and mobility: R26.89

## 2016-11-24 HISTORY — PX: CATARACT EXTRACTION W/PHACO: SHX586

## 2016-11-24 LAB — GLUCOSE, CAPILLARY
Glucose-Capillary: 114 mg/dL — ABNORMAL HIGH (ref 65–99)
Glucose-Capillary: 117 mg/dL — ABNORMAL HIGH (ref 65–99)

## 2016-11-24 SURGERY — PHACOEMULSIFICATION, CATARACT, WITH IOL INSERTION
Anesthesia: Monitor Anesthesia Care | Laterality: Right | Wound class: Clean

## 2016-11-24 MED ORDER — MOXIFLOXACIN HCL 0.5 % OP SOLN
OPHTHALMIC | Status: DC | PRN
  Administered 2016-11-24: .3 mL via OPHTHALMIC

## 2016-11-24 MED ORDER — PHENYLEPHRINE HCL 10 % OP SOLN
1.0000 [drp] | OPHTHALMIC | Status: DC | PRN
  Administered 2016-11-24 (×3): 1 [drp] via OPHTHALMIC

## 2016-11-24 MED ORDER — ACETAMINOPHEN 325 MG PO TABS: 325.0000 mg | ORAL_TABLET | ORAL | Status: DC | PRN

## 2016-11-24 MED ORDER — LIDOCAINE HCL (PF) 2 % IJ SOLN
INTRAOCULAR | Status: DC | PRN
  Administered 2016-11-24: 1 mL via INTRAOCULAR

## 2016-11-24 MED ORDER — NA HYALUR & NA CHOND-NA HYALUR 0.4-0.35 ML IO KIT
PACK | INTRAOCULAR | Status: DC | PRN
  Administered 2016-11-24: 1 mL via INTRAOCULAR

## 2016-11-24 MED ORDER — BSS IO SOLN
INTRAOCULAR | Status: DC | PRN
  Administered 2016-11-24: 70 mL via OPHTHALMIC

## 2016-11-24 MED ORDER — TETRACAINE HCL 0.5 % OP SOLN
1.0000 [drp] | OPHTHALMIC | Status: DC | PRN
  Administered 2016-11-24 (×2): 1 [drp] via OPHTHALMIC

## 2016-11-24 MED ORDER — LACTATED RINGERS IV SOLN: INTRAVENOUS | Status: DC

## 2016-11-24 MED ORDER — ACETAMINOPHEN 160 MG/5ML PO SOLN: 325.0000 mg | ORAL | Status: DC | PRN

## 2016-11-24 MED ORDER — MOXIFLOXACIN HCL 0.5 % OP SOLN
1.0000 [drp] | OPHTHALMIC | Status: DC | PRN
  Administered 2016-11-24 (×3): 1 [drp] via OPHTHALMIC

## 2016-11-24 MED ORDER — BRIMONIDINE TARTRATE-TIMOLOL 0.2-0.5 % OP SOLN
OPHTHALMIC | Status: DC | PRN
  Administered 2016-11-24: 1 [drp] via OPHTHALMIC

## 2016-11-24 MED ORDER — MIDAZOLAM HCL 2 MG/2ML IJ SOLN
INTRAMUSCULAR | Status: DC | PRN
  Administered 2016-11-24: 2 mg via INTRAVENOUS

## 2016-11-24 MED ORDER — CYCLOPENTOLATE HCL 2 % OP SOLN
1.0000 [drp] | OPHTHALMIC | Status: DC | PRN
  Administered 2016-11-24 (×3): 1 [drp] via OPHTHALMIC

## 2016-11-24 MED ORDER — FENTANYL CITRATE (PF) 100 MCG/2ML IJ SOLN
INTRAMUSCULAR | Status: DC | PRN
  Administered 2016-11-24 (×2): 50 ug via INTRAVENOUS

## 2016-11-24 SURGICAL SUPPLY — 29 items
CANNULA ANT/CHMB 27G (MISCELLANEOUS) ×1 IMPLANT
CANNULA ANT/CHMB 27GA (MISCELLANEOUS) ×3 IMPLANT
CARTRIDGE ABBOTT (MISCELLANEOUS) IMPLANT
GLOVE SURG LX 7.5 STRW (GLOVE) ×2
GLOVE SURG LX STRL 7.5 STRW (GLOVE) ×1 IMPLANT
GLOVE SURG TRIUMPH 8.0 PF LTX (GLOVE) ×3 IMPLANT
GOWN STRL REUS W/ TWL LRG LVL3 (GOWN DISPOSABLE) ×2 IMPLANT
GOWN STRL REUS W/TWL LRG LVL3 (GOWN DISPOSABLE) ×6
LENS IOL ACRSF IQ TRC 3 17.0 ×1 IMPLANT
LENS IOL ACRYSOF IQ TORIC 17.0 ×3 IMPLANT
LENS IOL IQ TORIC 3 17.0 ×1 IMPLANT
MARKER SKIN DUAL TIP RULER LAB (MISCELLANEOUS) ×3 IMPLANT
NDL FILTER BLUNT 18X1 1/2 (NEEDLE) ×1 IMPLANT
NDL RETROBULBAR .5 NSTRL (NEEDLE) IMPLANT
NEEDLE FILTER BLUNT 18X 1/2SAF (NEEDLE) ×2
NEEDLE FILTER BLUNT 18X1 1/2 (NEEDLE) ×1 IMPLANT
PACK CATARACT BRASINGTON (MISCELLANEOUS) ×3 IMPLANT
PACK EYE AFTER SURG (MISCELLANEOUS) ×3 IMPLANT
PACK OPTHALMIC (MISCELLANEOUS) ×3 IMPLANT
RING MALYGIN 7.0 (MISCELLANEOUS) IMPLANT
SUT ETHILON 10-0 CS-B-6CS-B-6 (SUTURE)
SUT VICRYL  9 0 (SUTURE)
SUT VICRYL 9 0 (SUTURE) IMPLANT
SUTURE EHLN 10-0 CS-B-6CS-B-6 (SUTURE) IMPLANT
SYR 3ML LL SCALE MARK (SYRINGE) ×3 IMPLANT
SYR 5ML LL (SYRINGE) ×3 IMPLANT
SYR TB 1ML LUER SLIP (SYRINGE) ×3 IMPLANT
WATER STERILE IRR 250ML POUR (IV SOLUTION) ×3 IMPLANT
WIPE NON LINTING 3.25X3.25 (MISCELLANEOUS) ×3 IMPLANT

## 2016-11-24 NOTE — Progress Notes (Signed)
CLATE, MACKERT ([id number]) Visit Report for 11/23/2016 Abuse/Suicide Risk Screen Details Patient Name: Justin Hancock, Justin Hancock. Date of Service: 11/23/2016 8:00 AM Medical Record Number: [id number] Patient Account Number: [id number] Date of Birth/Sex: 1926-10-28 (83 y.o. Male) Treating RN: Montey Hora Primary Care Porfiria Heinrich: [place] Pink Other Clinician: Referring Ardian Haberland: Lujean Amel Treating Tallie Dodds/Extender: Ricard Dillon Weeks in Treatment: 0 Abuse/Suicide Risk Screen Items Answer ABUSE/SUICIDE RISK SCREEN: Has anyone close to you tried to hurt or harm you recentlyo No Do you feel uncomfortable with anyone in your familyo No Has anyone forced you do things that you didnot want to doo No Do you have any thoughts of harming yourselfo No Patient displays signs or symptoms of abuse and/or neglect. No Electronic Signature(s) Signed: 11/23/2016 4:59:35 PM By: Montey Hora Entered By: Montey Hora on 11/23/2016 08:28:20 Justin Hancock ([id number]) -------------------------------------------------------------------------------- Activities of Daily Living Details Patient Name: Justin Hancock. Date of Service: 11/23/2016 8:00 AM Medical Record Number: [id number] Patient Account Number: [id number] Date of Birth/Sex: 07/14/1927 (83 y.o. Male) Treating RN: Montey Hora Primary Care Brax Walen: [place] Pink Other Clinician: Referring Falcon Mccaskey: Lujean Amel Treating Meilin Brosh/Extender: Ricard Dillon Weeks in Treatment: 0 Activities of Daily Living Items Answer Activities of Daily Living (Please select one for each item) Drive Automobile Completely Able Take Medications Completely Able Use Telephone Completely Able Care for Appearance Completely Able Use Toilet Completely Able Bath / Shower Completely Able Dress Self Completely Able Feed Self Completely Able Walk Completely Able Get In / Out Bed Completely Able Housework Completely Able Prepare Meals  Completely [place] for Self Completely Able Electronic Signature(s) Signed: 11/23/2016 4:59:35 PM By: Montey Hora Entered By: Montey Hora on 11/23/2016 08:28:34 Justin Hancock ([id number]) -------------------------------------------------------------------------------- Education Assessment Details Patient Name: Justin Hancock. Date of Service: 11/23/2016 8:00 AM Medical Record Number: [id number] Patient Account Number: [id number] Date of Birth/Sex: 1926-10-18 (84 y.o. Male) Treating RN: Montey Hora Primary Care Monzerat Handler: [place] Pink Other Clinician: Referring Rhett Mutschler: Lujean Amel Treating Ronesha Heenan/Extender: Tito Dine in Treatment: 0 Primary Learner Assessed: Patient Learning Preferences/Education Level/Primary Language Learning Preference: Explanation, Demonstration Highest Education Level: College or Above Preferred Language: English Cognitive Barrier Assessment/Beliefs Language Barrier: No Translator Needed: No Memory Deficit: No Emotional Barrier: No Cultural/Religious Beliefs Affecting Medical No Care: Physical Barrier Assessment Impaired Vision: No Impaired Hearing: No Decreased Hand dexterity: No Knowledge/Comprehension Assessment Knowledge Level: Medium Comprehension Level: Medium Ability to understand written Medium instructions: Ability to understand verbal Medium instructions: Motivation Assessment Anxiety Level: Calm Cooperation: Cooperative Education Importance: Acknowledges Need Interest in Health Problems: Asks Questions Perception: Coherent Willingness to Engage in Self- Medium Management Activities: Readiness to Engage in Self- Medium Management Activities: Electronic Signature(s) JRU, RANDEL ([id number]) Signed: 11/23/2016 4:59:35 PM By: Montey Hora Entered By: Montey Hora on 11/23/2016 08:28:59 Justin Hancock  ([id number]) -------------------------------------------------------------------------------- Fall Risk Assessment Details Patient Name: Justin Hancock. Date of Service: 11/23/2016 8:00 AM Medical Record Number: [id number] Patient Account Number: [id number] Date of Birth/Sex: [birth year] (83 y.o. Male) Treating RN: Montey Hora Primary Care Saladin Petrelli: [place] Pink Other Clinician: Referring Laterrian Hevener: Lujean Amel Treating Loriene Taunton/Extender: Tito Dine in Treatment: 0 Fall Risk Assessment Items Have you had 2 or more falls in the last 12 monthso 0 Yes Have you had any fall that resulted in injury in the last 12 monthso 0 Yes FALL RISK ASSESSMENT: History of falling - immediate or within 3 months 25 Yes Secondary diagnosis 0 No Ambulatory aid None/bed rest/wheelchair/nurse 0 No  Crutches/cane/walker 15 Yes Furniture 0 No IV Access/Saline Lock 0 No Gait/Training Normal/bed rest/immobile 0 Yes Weak 0 No Impaired 0 No Mental Status Oriented to own ability 0 Yes Electronic Signature(s) Signed: 11/23/2016 4:59:35 PM By: Montey Hora Entered By: Montey Hora on 11/23/2016 08:29:15 Justin Hancock ([id number]) -------------------------------------------------------------------------------- Foot Assessment Details Patient Name: Justin Hancock. Date of Service: 11/23/2016 8:00 AM Medical Record Number: [id number] Patient Account Number: [id number] Date of Birth/Sex: Feb 16, 1927 (83 y.o. Male) Treating RN: Montey Hora Primary Care Jamil Armwood: [place] Pink Other Clinician: Referring Indiyah Paone: Lujean Amel Treating Mirian Casco/Extender: Ricard Dillon Weeks in Treatment: 0 Foot Assessment Items Site Locations + = Sensation present, - = Sensation absent, C = Callus, U = Ulcer R = Redness, W = Warmth, M = Maceration, PU = Pre-ulcerative lesion F = Fissure, S = Swelling, D = Dryness Assessment Right: Left: Other Deformity: No No Prior Foot Ulcer: No  No Prior Amputation: No No Charcot Joint: No No Ambulatory Status: Ambulatory With Help Assistance Device: Walker Gait: Steady Electronic Signature(s) Signed: 11/23/2016 4:59:35 PM By: Montey Hora Entered By: Montey Hora on 11/23/2016 08:29:55 Justin Hancock ([id number]) -------------------------------------------------------------------------------- Nutrition Risk Assessment Details Patient Name: Justin Hancock. Date of Service: 11/23/2016 8:00 AM Medical Record Number: [id number] Patient Account Number: [id number] Date of Birth/Sex: [birth year] (83 y.o. Male) Treating RN: Montey Hora Primary Care Gilbert Narain: [place] Pink Other Clinician: Referring Dameian Crisman: Lujean Amel Treating Tyrica Afzal/Extender: Ricard Dillon Weeks in Treatment: 0 Height (in): 73 Weight (lbs): 242 Body Mass Index (BMI): 31.9 Nutrition Risk Assessment Items NUTRITION RISK SCREEN: I have an illness or condition that made me change the kind and/or 0 No amount of food I eat I eat fewer than two meals per day 0 No I eat few fruits and vegetables, or milk products 0 No I have three or more drinks of beer, liquor or wine almost every day 0 No I have tooth or mouth problems that make it hard for me to eat 0 No I don't always have enough money to buy the food I need 0 No I eat alone most of the time 0 No I take three or more different prescribed or over-the-counter drugs a 1 Yes day Without wanting to, I have lost or gained 10 pounds in the last six 0 No months I am not always physically able to shop, cook and/or feed myself 0 No Nutrition Protocols Good Risk Protocol 0 No interventions needed Moderate Risk Protocol Electronic Signature(s) Signed: 11/23/2016 4:59:35 PM By: Montey Hora Entered By: Montey Hora on 11/23/2016 [id number]

## 2016-11-24 NOTE — H&P (Signed)
The History and Physical notes are on paper, have been signed, and are to be scanned. The patient remains stable and unchanged from the H&P.   Previous H&P reviewed, patient examined, and there are no changes.  Justin Hancock 11/24/2016 7:47 AM

## 2016-11-24 NOTE — Transfer of Care (Signed)
Immediate Anesthesia Transfer of Care Note  Patient: Justin Hancock  Procedure(s) Performed: Procedure(s) with comments: CATARACT EXTRACTION PHACO AND INTRAOCULAR LENS PLACEMENT (IOC)  Right eye Diabetic  toric lens (Right) - Diabetic - oral meds Toric lens malyugin  Patient Location: PACU  Anesthesia Type: MAC  Level of Consciousness: awake, alert  and patient cooperative  Airway and Oxygen Therapy: Patient Spontanous Breathing and Patient connected to supplemental oxygen  Post-op Assessment: Post-op Vital signs reviewed, Patient's Cardiovascular Status Stable, Respiratory Function Stable, Patent Airway and No signs of Nausea or vomiting  Post-op Vital Signs: Reviewed and stable  Complications: No apparent anesthesia complications

## 2016-11-24 NOTE — Anesthesia Postprocedure Evaluation (Signed)
Anesthesia Post Note  Patient: Justin Hancock  Procedure(s) Performed: Procedure(s) (LRB): CATARACT EXTRACTION PHACO AND INTRAOCULAR LENS PLACEMENT (IOC)  Right eye Diabetic  toric lens (Right)  Patient location during evaluation: PACU Anesthesia Type: MAC Level of consciousness: awake and alert and oriented Pain management: satisfactory to patient Vital Signs Assessment: post-procedure vital signs reviewed and stable Respiratory status: spontaneous breathing, nonlabored ventilation and respiratory function stable Cardiovascular status: blood pressure returned to baseline and stable Postop Assessment: Adequate PO intake and No signs of nausea or vomiting Anesthetic complications: no    Raliegh Ip

## 2016-11-24 NOTE — Op Note (Signed)
LOCATION:  [place]   PREOPERATIVE DIAGNOSIS:  Nuclear sclerotic cataract of the right eye.  H25.11   POSTOPERATIVE DIAGNOSIS:  Nuclear sclerotic cataract of the right eye.   PROCEDURE:  Phacoemulsification with Toric posterior chamber intraocular lens placement of the right eye.   LENS:   Implant Name Type Inv. Item Serial No. Manufacturer Lot No. LRB No. Used  17.0D toric lens     [id number]   Right 1     [id number] Toric intraocular lens with 1.5 diopters of cylindrical power with axis orientation at 167 degrees.   ULTRASOUND TIME: 17 % of 1 minutes, 28 seconds.  CDE 14.9   SURGEON:  Wyonia Hough, MD   ANESTHESIA: Topical with tetracaine drops and 2% Xylocaine jelly, augmented with 1% preservative-free intracameral lidocaine. .   COMPLICATIONS:  None.   DESCRIPTION OF PROCEDURE:  The patient was identified in the holding room and transported to the operating suite and placed in the supine position under the operating microscope.  The right eye was identified as the operative eye, and it was prepped and draped in the usual sterile ophthalmic fashion.    A clear-corneal paracentesis incision was made at the 12:00 position.  0.5 ml of preservative-free 1% lidocaine was injected into the anterior chamber. The anterior chamber was filled with Viscoat.  A 2.4 millimeter near clear corneal incision was then made at the 9:00 position.  A cystotome and capsulorrhexis forceps were then used to make a curvilinear capsulorrhexis.  Hydrodissection and hydrodelineation were then performed using balanced salt solution.   Phacoemulsification was then used in stop and chop fashion to remove the lens, nucleus and epinucleus.  The remaining cortex was aspirated using the irrigation and aspiration handpiece.  Provisc viscoelastic was then placed into the capsular bag to distend it for lens placement.  The Verion digital marker was used to align the implant at the intended  axis.   A Toric lens was then injected into the capsular bag.  It was rotated clockwise until the axis marks on the lens were approximately 15 degrees in the counterclockwise direction to the intended alignment.  The viscoelastic was aspirated from the eye using the irrigation aspiration handpiece.  Then, a Koch spatula through the sideport incision was used to rotate the lens in a clockwise direction until the axis markings of the intraocular lens were lined up with the Verion alignment.  Balanced salt solution was then used to hydrate the wounds. Vigamox 0.2 ml of a 1mg  per ml solution was injected into the anterior chamber for a dose of 0.2 mg of intracameral antibiotic at the completion of the case.    The eye was noted to have a physiologic pressure and there was no wound leak noted.   Timolol and Brimonidine drops were applied to the eye.  The patient was taken to the recovery room in stable condition having had no complications of anesthesia or surgery.  Destin Kittler 11/24/2016, 8:51 AM

## 2016-11-24 NOTE — Anesthesia Preprocedure Evaluation (Signed)
Anesthesia Evaluation  Patient identified by MRN, date of birth, ID band Patient awake    Reviewed: Allergy & Precautions, H&P , NPO status , Patient's Chart, lab work & pertinent test results  Airway Mallampati: II  TM Distance: >3 FB Neck ROM: full    Dental  (+) Chipped   Pulmonary sleep apnea , former smoker,    Pulmonary exam normal        Cardiovascular hypertension, + angina + CAD  Normal cardiovascular exam     Neuro/Psych    GI/Hepatic   Endo/Other  diabetes  Renal/GU Renal disease     Musculoskeletal   Abdominal   Peds  Hematology   Anesthesia Other Findings   Reproductive/Obstetrics                             Anesthesia Physical Anesthesia Plan  ASA: III  Anesthesia Plan: MAC   Post-op Pain Management:    Induction:   Airway Management Planned:   Additional Equipment:   Intra-op Plan:   Post-operative Plan:   Informed Consent: I have reviewed the patients History and Physical, chart, labs and discussed the procedure including the risks, benefits and alternatives for the proposed anesthesia with the patient or authorized representative who has indicated his/her understanding and acceptance.     Plan Discussed with:   Anesthesia Plan Comments:         Anesthesia Quick Evaluation

## 2016-11-24 NOTE — Anesthesia Procedure Notes (Signed)
Procedure Name: MAC Performed by: Kodiak Rollyson Pre-anesthesia Checklist: Patient identified, Emergency Drugs available, Suction available, Timeout performed and Patient being monitored Patient Re-evaluated:Patient Re-evaluated prior to inductionOxygen Delivery Method: Nasal cannula Placement Confirmation: positive ETCO2     

## 2016-11-24 NOTE — Progress Notes (Addendum)
WINSLOW, ARTIAGA ([id number]) Visit Report for 11/23/2016 Allergy List Details Patient Name: SELMA, BRYER. Date of Service: 11/23/2016 8:00 AM Medical Record Number: [id number] Patient Account Number: [id number] Date of Birth/Sex: October 07, 1926 (83 y.o. Male) Treating RN: Montey Hora Primary Care Parry Po: [place] Pink Other Clinician: Referring Mykell Genao: Lujean Amel Treating English Tomer/Extender: Ricard Dillon Weeks in Treatment: 0 Allergies Active Allergies penicillin Allergy Notes Electronic Signature(s) Signed: 11/23/2016 4:59:35 PM By: Montey Hora Entered By: Montey Hora on 11/23/2016 08:22:02 [name] ([id number]) -------------------------------------------------------------------------------- Arrival Information Details Patient Name: [name] Date of Service: 11/23/2016 8:00 AM Medical Record Number: [id number] Patient Account Number: [id number] Date of Birth/Sex: April 17, 1927 (83 y.o. Male) Treating RN: Montey Hora Primary Care Olivia Pavelko: [place] Pink Other Clinician: Referring Azha Constantin: Lujean Amel Treating Vesta Wheeland/Extender: Tito Dine in Treatment: 0 Visit Information Patient Arrived: Charlyn Minerva Time: 08:19 Accompanied By: self Transfer Assistance: None Patient Identification Verified: Yes Secondary Verification Process Yes Completed: Patient Has Alerts: Yes Patient Alerts: DMII ABI [place] BILATERAL Electronic Signature(s) Signed: 11/23/2016 4:59:35 PM By: Montey Hora Entered By: Montey Hora on 11/23/2016 08:47:39 [name] ([id number]) -------------------------------------------------------------------------------- Clinic Level of Care Assessment Details Patient Name: [name] Date of Service: 11/23/2016 8:00 AM Medical Record Number: [id number] Patient Account Number: [id number] Date of Birth/Sex: 09-Oct-1926 (83 y.o. Male) Treating RN: Montey Hora Primary Care Sabir Charters: [place] Pink Other Clinician: Referring Gayland Nicol: Lujean Amel Treating Kaden Dunkel/Extender: Tito Dine in Treatment: 0 Clinic Level of Care Assessment Items TOOL 1 Quantity Score []  - Use when EandM and Procedure is performed on INITIAL visit 0 ASSESSMENTS - Nursing Assessment / Reassessment X - General Physical Exam (combine w/ comprehensive assessment (listed just 1 20 below) when performed on new pt. evals) X - Comprehensive Assessment (HX, ROS, Risk Assessments, Wounds Hx, etc.) 1 25 ASSESSMENTS - Wound and Skin Assessment / Reassessment X - Dermatologic / Skin Assessment (not related to wound area) 1 10 ASSESSMENTS - Ostomy and/or Continence Assessment and Care []  - Incontinence Assessment and Management 0 []  - Ostomy Care Assessment and Management (repouching, etc.) 0 PROCESS - Coordination of Care X - Simple Patient / Family Education for ongoing care 1 15 []  - Complex (extensive) Patient / Family Education for ongoing care 0 []  - Staff obtains Programmer, systems, Records, Test Results / Process Orders 0 []  - Staff telephones HHA, Nursing Homes / Clarify orders / etc 0 []  - Routine Transfer to another Facility (non-emergent condition) 0 []  - Routine Hospital Admission (non-emergent condition) 0 X - New Admissions / Biomedical engineer / Ordering NPWT, Apligraf, etc. 1 15 []  - Emergency Hospital Admission (emergent condition) 0 PROCESS - Special Needs []  - Pediatric / Minor Patient Management 0 []  - Isolation Patient Management 0 DAMARE, URY. ([id number]) []  - Hearing / Language / Visual special needs 0 []  - Assessment of Community assistance (transportation, D/C planning, etc.) 0 []  - Additional assistance / Altered mentation 0 []  - Support Surface(s) Assessment (bed, cushion, seat, etc.) 0 INTERVENTIONS - Miscellaneous []  - External ear exam 0 []  - Patient Transfer (multiple staff / Civil Service fast streamer / Similar devices) 0 []  - Simple Staple / Suture removal (25 or less)  0 []  - Complex Staple / Suture removal (26 or more) 0 []  - Hypo/Hyperglycemic Management (do not check if billed separately) 0 X - Ankle / Brachial Index (ABI) - do not check if billed separately 1 15 Has the patient been seen at the hospital within the last three years:  Yes Total Score: 100 Level Of Care: New/Established - Level 3 Electronic Signature(s) Signed: 11/23/2016 4:59:35 PM By: Montey Hora Entered By: Montey Hora on 11/23/2016 09:49:49 [name] ([id number]) -------------------------------------------------------------------------------- Encounter Discharge Information Details Patient Name: LAMART, ANNIS. Date of Service: 11/23/2016 8:00 AM Medical Record Number: [id number] Patient Account Number: [id number] Date of Birth/Sex: 1927-08-26 (83 y.o. Male) Treating RN: Montey Hora Primary Care Raliyah Montella: [place] Pink Other Clinician: Referring Kipling Graser: Lujean Amel Treating Zhane Bluitt/Extender: Tito Dine in Treatment: 0 Encounter Discharge Information Items Discharge Pain Level: 0 Discharge Condition: Stable Ambulatory Status: Walker Discharge Destination: Home Transportation: Private Auto Accompanied By: self Schedule Follow-up Appointment: Yes Medication Reconciliation completed No and provided to Patient/Care Mizani Dilday: Provided on Clinical Summary of Care: 11/23/2016 Form Type Recipient Paper Patient RB Electronic Signature(s) Signed: 11/23/2016 9:47:53 AM By: Montey Hora Previous Signature: 11/23/2016 9:18:29 AM Version By: Ruthine Dose Entered By: Montey Hora on 11/23/2016 09:47:53 [name] ([id number]) -------------------------------------------------------------------------------- Lower Extremity Assessment Details Patient Name: [name]. Date of Service: 11/23/2016 8:00 AM Medical Record Number: [id number] Patient Account Number: [id number] Date of Birth/Sex: 10-Feb-1927 (83 y.o. Male) Treating RN: Montey Hora Primary Care Ladajah Soltys: [place] Pink Other Clinician: Referring Chalon Zobrist: Lujean Amel Treating Manasseh Pittsley/Extender: Ricard Dillon Weeks in Treatment: 0 Edema Assessment Assessed: [Left: No] [Right: No] Edema: [Left: Yes] [Right: Yes] Calf Left: Right: Point of Measurement: 34 cm From Medial Instep 39 cm 38.2 cm Ankle Left: Right: Point of Measurement: 12 cm From Medial Instep 27.7 cm 28 cm Vascular Assessment Pulses: Dorsalis Pedis Palpable: [Left:Yes] [Right:Yes] Posterior Tibial Palpable: [Left:Yes] [Right:Yes] Extremity colors, hair growth, and conditions: Extremity Color: [Left:Hyperpigmented] [Right:Hyperpigmented] Hair Growth on Extremity: [Left:No] [Right:No] Temperature of Extremity: [Left:Warm] [Right:Warm] Capillary Refill: [Left:< 3 seconds] [Right:< 3 seconds] Toe Nail Assessment Left: Right: Thick: Yes Yes Discolored: Yes Yes Deformed: No No Improper Length and Hygiene: No No Notes ABI [place] BILATERAL - SOUND DID NOT RETURN Electronic Signature(s) Signed: 11/23/2016 4:59:35 PM By: Izetta Dakin, Orlando Penner ([id number]) Entered By: Montey Hora on 11/23/2016 08:42:04 [name] ([id number]) -------------------------------------------------------------------------------- Multi Wound Chart Details Patient Name: [name]. Date of Service: 11/23/2016 8:00 AM Medical Record Number: [id number] Patient Account Number: [id number] Date of Birth/Sex: 11/10/1926 (83 y.o. Male) Treating RN: Montey Hora Primary Care Jakelin Taussig: [place] Pink Other Clinician: Referring Zunairah Devers: Lujean Amel Treating Geanna Divirgilio/Extender: Ricard Dillon Weeks in Treatment: 0 Vital Signs Height(in): 73 Pulse(bpm): 60 Weight(lbs): 242 Blood Pressure 152/52 (mmHg): Body Mass Index(BMI): 32 Temperature(F): 97.8 Respiratory Rate 16 (breaths/min): Photos: [N/A:N/A] Wound Location: Right Elbow N/A N/A Wounding Event: Trauma N/A N/A Primary  Etiology: Trauma, Other N/A N/A Comorbid History: Sleep Apnea, Coronary N/A N/A Artery Disease, Hypertension, Type II Diabetes Date Acquired: 10/25/2016 N/A N/A Weeks of Treatment: 0 N/A N/A Wound Status: Open N/A N/A Measurements L x W x D 2.5x2x0.1 N/A N/A (cm) Area (cm) : 3.927 N/A N/A Volume (cm) : 0.393 N/A N/A Classification: Full Thickness Without N/A N/A Exposed Support Structures Exudate Amount: Medium N/A N/A Exudate Type: Serous N/A N/A Exudate Color: amber N/A N/A Wound Margin: Flat and Intact N/A N/A Granulation Amount: Medium (34-66%) N/A N/A Granulation Quality: Pink N/A N/A TRAVES, STOGSDILL. ([id number]) Necrotic Amount: Medium (34-66%) N/A N/A Necrotic Tissue: Eschar, Adherent Slough N/A N/A Exposed Structures: Fascia: No N/A N/A Fat Layer (Subcutaneous Tissue) Exposed: No Tendon: No Muscle: No Joint: No Bone: No Limited to Skin Breakdown Epithelialization: Small (1-33%) N/A N/A Debridement: Debridement [id number] N/A N/A [id number]) Pre-procedure  08:57 N/A N/A Verification/Time Out Taken: Pain Control: Lidocaine 4% Topical N/A N/A Solution Tissue Debrided: Necrotic/Eschar, Blood N/A N/A Clots, Subcutaneous Level: Skin/Subcutaneous N/A N/A Tissue Debridement Area (sq 5 N/A N/A cm): Instrument: Curette N/A N/A Bleeding: Minimum N/A N/A Hemostasis Achieved: Pressure N/A N/A Procedural Pain: 0 N/A N/A Post Procedural Pain: 0 N/A N/A Debridement Treatment Procedure was tolerated N/A N/A Response: well Post Debridement 2.5x2x0.1 N/A N/A Measurements L x W x D (cm) Post Debridement 0.393 N/A N/A Volume: (cm) Periwound Skin Texture: Excoriation: No N/A N/A Induration: No Callus: No Crepitus: No Rash: No Scarring: No Periwound Skin Maceration: No N/A N/A Moisture: Dry/Scaly: No Periwound Skin Color: Atrophie Blanche: No N/A N/A Cyanosis: No Ecchymosis: No Erythema: No Hemosiderin Staining: No Norlander, Emilian E. ([id number]) Mottled:  No Pallor: No Rubor: No Temperature: No Abnormality N/A N/A Tenderness on No N/A N/A Palpation: Wound Preparation: Ulcer Cleansing: N/A N/A Rinsed/Irrigated with Saline Topical Anesthetic Applied: Other: lidocaine 4% Procedures Performed: Debridement N/A N/A Treatment Notes Wound #1 (Right Elbow) 1. Cleansed with: Clean wound with Normal Saline 2. Anesthetic Topical Lidocaine 4% cream to wound bed prior to debridement 4. Dressing Applied: Prisma Ag 5. Secondary [place] Signature(s) Signed: 11/24/2016 3:56:26 PM By: Linton Ham MD Previous Signature: 11/23/2016 9:49:30 AM Version By: Montey Hora Entered By: Linton Ham on 11/24/2016 07:50:50 [name] ([id number]) -------------------------------------------------------------------------------- Multi-Disciplinary Care Plan Details Patient Name: MASOOD, STCLAIR. Date of Service: 11/23/2016 8:00 AM Medical Record Number: [id number] Patient Account Number: [id number] Date of Birth/Sex: 1926-10-22 (83 y.o. Male) Treating RN: Montey Hora Primary Care Isiaah Cuervo: [place] Pink Other Clinician: Referring Vega Stare: Lujean Amel Treating Jaxsyn Azam/Extender: Tito Dine in Treatment: 0 Active Inactive Electronic Signature(s) Signed: 12/21/2016 3:07:57 PM By: Gretta Cool RN, BSN, Kim RN, BSN Signed: 01/06/2017 4:57:05 PM By: Montey Hora Previous Signature: 11/23/2016 9:49:12 AM Version By: Montey Hora Entered By: Gretta Cool RN, BSN, Kim on 12/21/2016 15:07:56 [name] ([id number]) -------------------------------------------------------------------------------- Pain Assessment Details Patient Name: JODAN, BAPTIST. Date of Service: 11/23/2016 8:00 AM Medical Record Number: [id number] Patient Account Number: [id number] Date of Birth/Sex: 20-Aug-1927 (83 y.o. Male) Treating RN: Montey Hora Primary Care Marleny Faller: [place] Pink Other Clinician: Referring Vearl Aitken:  Lujean Amel Treating Jashira Cotugno/Extender: Tito Dine in Treatment: 0 Active Problems Location of Pain Severity and Description of Pain Patient Has Paino No Site Locations Pain Management and Medication Current Pain Management: Notes Topical or injectable lidocaine is offered to patient for acute pain when surgical debridement is performed. If needed, Patient is instructed to use over the counter pain medication for the following 24-48 hours after debridement. Wound care MDs do not prescribed pain medications. Patient has chronic pain or uncontrolled pain. Patient has been instructed to make an appointment with their Primary Care Physician for pain management. Electronic Signature(s) Signed: 11/23/2016 4:59:35 PM By: Montey Hora Entered By: Montey Hora on 11/23/2016 08:20:51 [name] ([id number]) -------------------------------------------------------------------------------- Patient/Caregiver Education Details Patient Name: [name] Date of Service: 11/23/2016 8:00 AM Medical Record Number: [id number] Patient Account Number: [id number] Date of Birth/Gender: [birth year] (83 y.o. Male) Treating RN: Montey Hora Primary Care Physician: [place] Pink Other Clinician: Referring Physician: Lujean Amel Treating Physician/Extender: Tito Dine in Treatment: 0 Education Assessment Education Provided To: Patient Education Topics Provided Wound/Skin Impairment: Handouts: Other: wound care as ordered Methods: Demonstration, Explain/Verbal Responses: State content correctly Electronic Signature(s) Signed: 11/23/2016 4:59:35 PM By: Montey Hora Entered By: Montey Hora on 11/23/2016 09:48:09 [name] ([id number]) --------------------------------------------------------------------------------  Wound Assessment Details Patient Name: TERESO, PINK. Date of Service: 11/23/2016 8:00 AM Medical Record Number:  [id number] Patient Account Number: [id number] Date of Birth/Sex: 1926-11-18 (83 y.o. Male) Treating RN: Montey Hora Primary Care Mahayla Haddaway: [place] Pink Other Clinician: Referring Akhil Piscopo: Lujean Amel Treating Muzammil Bruins/Extender: Ricard Dillon Weeks in Treatment: 0 Wound Status Wound Number: 1 Primary Trauma, Other Etiology: Wound Location: Right Elbow Wound Open Wounding Event: Trauma Status: Date Acquired: 10/25/2016 Comorbid Sleep Apnea, Coronary Artery Weeks Of Treatment: 0 History: Disease, Hypertension, Type II Clustered Wound: No Diabetes Photos Photo Uploaded By: Montey Hora on 11/23/2016 11:28:12 Wound Measurements Length: (cm) 2.5 Width: (cm) 2 Depth: (cm) 0.1 Area: (cm) 3.927 Volume: (cm) 0.393 % Reduction in Area: % Reduction in Volume: Epithelialization: Small (1-33%) Tunneling: No Undermining: No Wound Description Full Thickness Without Exposed Foul Odor After Classification: Support Structures Slough/Fibrino Wound Margin: Flat and Intact Exudate Medium Amount: Exudate Type: Serous Exudate Color: amber Cleansing: No Yes Wound Bed Granulation Amount: Medium (34-66%) Exposed Structure Granulation Quality: Pink Fascia Exposed: No Reise, Tylyn E. ([id number]) Necrotic Amount: Medium (34-66%) Fat Layer (Subcutaneous Tissue) Exposed: No Necrotic Quality: Eschar, Adherent Slough Tendon Exposed: No Muscle Exposed: No Joint Exposed: No Bone Exposed: No Limited to Skin Breakdown Periwound Skin Texture Texture Color No Abnormalities Noted: No No Abnormalities Noted: No Callus: No Atrophie Blanche: No Crepitus: No Cyanosis: No Excoriation: No Ecchymosis: No Induration: No Erythema: No Rash: No Hemosiderin Staining: No Scarring: No Mottled: No Pallor: No Moisture Rubor: No No Abnormalities Noted: No Dry / Scaly: No Temperature / Pain Maceration: No Temperature: No Abnormality Wound Preparation Ulcer Cleansing:  Rinsed/Irrigated with Saline Topical Anesthetic Applied: Other: lidocaine 4%, Electronic Signature(s) Signed: 11/23/2016 4:59:35 PM By: Montey Hora Entered By: Montey Hora on 11/23/2016 09:00:59 [name] ([id number]) -------------------------------------------------------------------------------- Burns Details Patient Name: [name]. Date of Service: 11/23/2016 8:00 AM Medical Record Number: [id number] Patient Account Number: [id number] Date of Birth/Sex: 03-Jan-1927 (83 y.o. Male) Treating RN: Montey Hora Primary Care Giuseppina Quinones: [place] Pink Other Clinician: Referring Hessie Varone: Lujean Amel Treating Tanis Burnley/Extender: Tito Dine in Treatment: 0 Vital Signs Time Taken: 08:21 Temperature (F): 97.8 Height (in): 73 Pulse (bpm): 60 Source: Measured Respiratory Rate (breaths/min): 16 Weight (lbs): 242 Blood Pressure (mmHg): 152/52 Source: Measured Reference Range: 80 - 120 mg / dl Body Mass Index (BMI): 31.9 Electronic Signature(s) Signed: 11/23/2016 4:59:35 PM By: Montey Hora Entered By: Montey Hora on 11/23/2016 08:21:53

## 2016-11-25 NOTE — Progress Notes (Signed)
Justin Hancock ([id number]) Visit Report for 11/23/2016 Chief Complaint Document Details Patient Name: Justin Hancock, Justin Hancock. Date of Service: 11/23/2016 8:00 AM Medical Record Number: [id number] Patient Account Number: [id number] Date of Birth/Sex: [birth year] (83 y.o. Male) Treating RN: Montey Hora Primary Care Provider: [place] Pink Other Clinician: Referring Provider: Lujean Amel Treating Provider/Extender: Tito Dine in Treatment: 0 Information Obtained from: Patient Chief Complaint 11/23/16; patient is here for review of a wound on his right lower leg however an intake we also looked at and area on his right anterior knee and right elbow Electronic Signature(s) Signed: 11/24/2016 3:56:26 PM By: Linton Ham MD Entered By: Linton Ham on 11/24/2016 07:52:17 Justin Hancock ([id number]) -------------------------------------------------------------------------------- Debridement Details Patient Name: Justin Hancock. Date of Service: 11/23/2016 8:00 AM Medical Record Number: [id number] Patient Account Number: [id number] Date of Birth/Sex: [birth year] (83 y.o. Male) Treating RN: Montey Hora Primary Care Provider: Maryland Pink Other Clinician: Referring Provider: Lujean Amel Treating Provider/Extender: Tito Dine in Treatment: 0 Debridement Performed for Wound #1 Right Elbow Assessment: Performed By: Physician Ricard Dillon, MD Debridement: Debridement Pre-procedure Yes - 08:57 Verification/Time Out Taken: Start Time: 08:57 Pain Control: Lidocaine 4% Topical Solution Level: Skin/Subcutaneous Tissue Total Area Debrided (L x 2.5 (cm) x 2 (cm) = 5 (cm) W): Tissue and other Viable, Non-Viable, Blood Clots, Eschar, Subcutaneous material debrided: Instrument: Curette Bleeding: Minimum Hemostasis Achieved: Pressure End Time: 09:00 Procedural Pain: 0 Post Procedural Pain: 0 Response to Treatment: Procedure was tolerated  well Post Debridement Measurements of Total Wound Length: (cm) 2.5 Width: (cm) 2 Depth: (cm) 0.1 Volume: (cm) 0.393 Character of Wound/Ulcer Post Improved Debridement: Severity of Tissue Post Debridement: Fat layer exposed Post Procedure Diagnosis Same as Pre-procedure Electronic Signature(s) Signed: 11/24/2016 3:56:26 PM By: Linton Ham MD Signed: 11/24/2016 4:29:36 PM By: Montey Hora Previous Signature: 11/23/2016 4:59:35 PM Version By: Montey Hora Entered By: Linton Ham on 11/24/2016 07:51:04 Justin Hancock ([id number] Chad, Orlando Penner ([id number]) -------------------------------------------------------------------------------- HPI Details Patient Name: Justin Hancock. Date of Service: 11/23/2016 8:00 AM Medical Record Number: [id number] Patient Account Number: [id number] Date of Birth/Sex: 10-14-1926 (83 y.o. Male) Treating RN: Montey Hora Primary Care Provider: Maryland Pink Other Clinician: Referring Provider: Lujean Amel Treating Provider/Extender: Tito Dine in Treatment: 0 History of Present Illness HPI Description: 11/23/16; this is an 83 year old man who is having a history of coronary artery disease including multiple PCI's and stent placement. He also has diabetes hypertension COPD sleep apnea and a right to diabetic foot ulcer. During an evaluation at his primary 49 office on 2/12 he was noted to have a chronic diabetic foot ulcer with venous stasis and he was referred here. He is known to Dr. Lucky Cowboy. The patient tells [place] that he had a blister develop on his right lower leg one month ago. He has been using topical antibiotics. In the meantime he has had at least one fall and developed a wound on his right anterior knee and nor more recently on the dorsal aspect of his right elbow. The exact cause of these falls is not really clear they are clearly not syncopal ABIs in this clinic were noncompressible  bilaterally Electronic Signature(s) Signed: 11/24/2016 3:56:26 PM By: Linton Ham MD Entered By: Linton Ham on 11/24/2016 07:55:01 Justin Hancock ([id number]) -------------------------------------------------------------------------------- Physical Exam Details Patient Name: Justin Hancock. Date of Service: 11/23/2016 8:00 AM Medical Record Number: [id number] Patient Account Number: [id number] Date of Birth/Sex: June 08, 1927 (83 y.o. Male) Treating RN: Montey Hora  Primary Care Provider: Maryland Pink Other Clinician: Referring Provider: Lujean Amel Treating Provider/Extender: Tito Dine in Treatment: 0 Constitutional Patient is hypertensive.. Pulse regular and within target range for patient.Marland Kitchen Respirations regular, non-labored and within target range.. Temperature is normal and within the target range for the patient.. Patient's appearance is neat and clean. Appears in no acute distress. Well nourished and well developed.Marland Kitchen Respiratory Respiratory effort is easy and symmetric bilaterally. Rate is normal at rest and on room air.. Bilateral breath sounds are clear and equal in all lobes with no wheezes, rales or rhonchi.. Cardiovascular Atrial fibrillation no murmurs. He appears to be euvolemic. Pedal pulses palpable bilaterally. Evidence of chronic venous insufficiency. Notes Wound exam; the original concerning area on his right lower leg wasn't eschar. During our intake process our nurse washed his leg the eschar was removed and there was nothing but normal skin underneath this. He also had a fairly substantial area on the right anterior knee although all of this is already epithelialized as well. The area on his right elbow which was again trauma from a fall had surrounding nonviable skin subcutaneous tissue and eschar circumferentially. All of this I removed with a #3 curet there was was no bleeding. He tolerated this well. Electronic  Signature(s) Signed: 11/24/2016 3:56:26 PM By: Linton Ham MD Entered By: Linton Ham on 11/24/2016 07:56:55 Justin Hancock ([id number]) -------------------------------------------------------------------------------- Physician Orders Details Patient Name: Justin Hancock. Date of Service: 11/23/2016 8:00 AM Medical Record Number: [id number] Patient Account Number: [id number] Date of Birth/Sex: [birth year] (83 y.o. Male) Treating RN: Montey Hora Primary Care Provider: [place] Pink Other Clinician: Referring Provider: Lujean Amel Treating Provider/Extender: Tito Dine in Treatment: 0 Verbal / Phone Orders: No Diagnosis Coding Wound Cleansing Wound #1 Right Elbow o Clean wound with Normal Saline. Anesthetic Wound #1 Right Elbow o Topical Lidocaine 4% cream applied to wound bed prior to debridement Primary Wound Dressing Wound #1 Right Elbow o Prisma Ag Secondary Dressing Wound #1 Right Elbow o Boardered Foam Dressing - or telfa island Dressing Change Frequency Wound #1 Right Elbow o Change dressing every other day. Follow-up Appointments Wound #1 Right Elbow o Return Appointment in 1 week. Electronic Signature(s) Signed: 11/23/2016 4:59:35 PM By: Montey Hora Signed: 11/24/2016 3:56:26 PM By: Linton Ham MD Entered By: Montey Hora on 11/23/2016 [place], [place]. ([id number]) -------------------------------------------------------------------------------- Problem List Details Patient Name: SATCHEL, GRENDA. Date of Service: 11/23/2016 8:00 AM Medical Record Number: [id number] Patient Account Number: [id number] Date of Birth/Sex: 1927/02/24 (83 y.o. Male) Treating RN: Montey Hora Primary Care Provider: Maryland Pink Other Clinician: Referring Provider: Lujean Amel Treating Provider/Extender: Tito Dine in Treatment: 0 Active Problems ICD-10 Encounter Code Description Active  Date Diagnosis I87.331 Chronic venous hypertension (idiopathic) with ulcer and 11/23/2016 Yes inflammation of right lower extremity L97.211 Non-pressure chronic ulcer of right calf limited to 11/23/2016 Yes breakdown of skin S51.811D Laceration without foreign body of right forearm, 11/23/2016 Yes subsequent encounter E11.42 Type 2 diabetes mellitus with diabetic polyneuropathy 11/23/2016 Yes Inactive Problems Resolved Problems Electronic Signature(s) Signed: 11/24/2016 3:56:26 PM By: Linton Ham MD Entered By: Linton Ham on 11/24/2016 07:50:38 Justin Hancock ([id number]) -------------------------------------------------------------------------------- Progress Note Details Patient Name: Justin Hancock. Date of Service: 11/23/2016 8:00 AM Medical Record Number: [id number] Patient Account Number: [id number] Date of Birth/Sex: 08-30-1927 (83 y.o. Male) Treating RN: Montey Hora Primary Care Provider: [place] Pink Other Clinician: Referring Provider: Lujean Amel Treating Provider/Extender: Tito Dine in Treatment: 0 Subjective Chief  Complaint Information obtained from Patient 11/23/16; patient is here for review of a wound on his right lower leg however an intake we also looked at and area on his right anterior knee and right elbow History of Present Illness (HPI) 11/23/16; this is an 83 year old man who is having a history of coronary artery disease including multiple PCI's and stent placement. He also has diabetes hypertension COPD sleep apnea and a right to diabetic foot ulcer. During an evaluation at his primary 44 office on 2/12 he was noted to have a chronic diabetic foot ulcer with venous stasis and he was referred here. He is known to Dr. Lucky Cowboy. The patient tells [place] that he had a blister develop on his right lower leg one month ago. He has been using topical antibiotics. In the meantime he has had at least one fall and developed a wound on his  right anterior knee and nor more recently on the dorsal aspect of his right elbow. The exact cause of these falls is not really clear they are clearly not syncopal ABIs in this clinic were noncompressible bilaterally Wound History Patient presents with 1 open wound that has been present for approximately 1 month. Patient has been treating wound in the following manner: TCA, triple antibiotic ointment. Laboratory tests have been performed in the last month. Patient reportedly has not tested positive for an antibiotic resistant organism. Patient reportedly has not tested positive for osteomyelitis. Patient reportedly has had testing performed to evaluate circulation in the legs. Patient experiences the following problems associated with their wounds: swelling. Patient History Information obtained from Patient. Allergies penicillin Social History Former smoker - quit times 70 years, Marital Status - Married, Alcohol Use - Never, Drug Use - No History, Caffeine Use - Daily. Medical History KAHLIN, SCHAUFLER ([id number]) Respiratory Patient has history of Sleep Apnea Denies history of Chronic Obstructive Pulmonary Disease (COPD) Cardiovascular Patient has history of Coronary Artery Disease, Hypertension Endocrine Patient has history of Type II Diabetes Oncologic Denies history of Received Chemotherapy, Received Radiation Patient is treated with Oral Agents. Blood sugar is tested. Medical And Surgical History Notes Cardiovascular venous stasis, cardiomyopathy Genitourinary BPH Neurologic Bells Palsy Oncologic colon cancer - surgical treatment only Review of Systems (ROS) Constitutional Symptoms (General Health) The patient has no complaints or symptoms. Eyes The patient has no complaints or symptoms. Ear/Nose/Mouth/Throat The patient has no complaints or symptoms. Hematologic/Lymphatic The patient has no complaints or symptoms. Respiratory The patient has no complaints or  symptoms. Cardiovascular Complains or has symptoms of LE edema. Gastrointestinal The patient has no complaints or symptoms. Endocrine The patient has no complaints or symptoms. Genitourinary The patient has no complaints or symptoms. Immunological The patient has no complaints or symptoms. Integumentary (Skin) The patient has no complaints or symptoms. Musculoskeletal The patient has no complaints or symptoms. Neurologic The patient has no complaints or symptoms. Psychiatric The patient has no complaints or symptoms. AKIR, WILMES ([id number]) Objective Constitutional Patient is hypertensive.. Pulse regular and within target range for patient.Marland Kitchen Respirations regular, non-labored and within target range.. Temperature is normal and within the target range for the patient.. Patient's appearance is neat and clean. Appears in no acute distress. Well nourished and well developed.. Vitals Time Taken: 8:21 AM, Height: 73 in, Source: Measured, Weight: 242 lbs, Source: Measured, BMI: 31.9, Temperature: 97.8 F, Pulse: 60 bpm, Respiratory Rate: 16 breaths/min, Blood Pressure: 152/52 mmHg. Respiratory Respiratory effort is easy and symmetric bilaterally. Rate is normal at rest and on room air.. Bilateral  breath sounds are clear and equal in all lobes with no wheezes, rales or rhonchi.. Cardiovascular Atrial fibrillation no murmurs. He appears to be euvolemic. Pedal pulses palpable bilaterally. Evidence of chronic venous insufficiency. General Notes: Wound exam; the original concerning area on his right lower leg wasn't eschar. During our intake process our nurse washed his leg the eschar was removed and there was nothing but normal skin underneath this. He also had a fairly substantial area on the right anterior knee although all of this is already epithelialized as well. The area on his right elbow which was again trauma from a fall had surrounding nonviable skin subcutaneous tissue and  eschar circumferentially. All of this I removed with a #3 curet there was was no bleeding. He tolerated this well. Integumentary (Hair, Skin) Wound #1 status is Open. Original cause of wound was Trauma. The wound is located on the Right Elbow. The wound measures 2.5cm length x 2cm width x 0.1cm depth; 3.927cm^2 area and 0.393cm^3 volume. The wound is limited to skin breakdown. There is no tunneling or undermining noted. There is a medium amount of serous drainage noted. The wound margin is flat and intact. There is medium (34-66%) pink granulation within the wound bed. There is a medium (34-66%) amount of necrotic tissue within the wound bed including Eschar and Adherent Slough. The periwound skin appearance did not exhibit: Callus, Crepitus, Excoriation, Induration, Rash, Scarring, Dry/Scaly, Maceration, Atrophie Blanche, Cyanosis, Ecchymosis, Hemosiderin Staining, Mottled, Pallor, Rubor, Erythema. Periwound temperature was noted as No Abnormality. Assessment MONTAGUE, TYLUTKI ([id number]) Active Problems ICD-10 I87.331 - Chronic venous hypertension (idiopathic) with ulcer and inflammation of right lower extremity L97.211 - Non-pressure chronic ulcer of right calf limited to breakdown of skin S51.811D - Laceration without foreign body of right forearm, subsequent encounter E11.42 - Type 2 diabetes mellitus with diabetic polyneuropathy Procedures Wound #1 Wound #1 is a Trauma, Other located on the Right Elbow . There was a Skin/Subcutaneous Tissue Debridement [id number]) debridement with total area of 5 sq cm performed by Ricard Dillon, MD. with the following instrument(s): Curette to remove Viable and Non-Viable tissue/material including Blood Clots, Eschar, and Subcutaneous after achieving pain control using Lidocaine 4% Topical Solution. A time out was conducted at 08:57, prior to the start of the procedure. A Minimum amount of bleeding was controlled with Pressure. The procedure  was tolerated well with a pain level of 0 throughout and a pain level of 0 following the procedure. Post Debridement Measurements: 2.5cm length x 2cm width x 0.1cm depth; 0.393cm^3 volume. Character of Wound/Ulcer Post Debridement is improved. Severity of Tissue Post Debridement is: Fat layer exposed. Post procedure Diagnosis Wound #1: Same as Pre-Procedure Plan Wound Cleansing: Wound #1 Right Elbow: Clean wound with Normal Saline. Anesthetic: Wound #1 Right Elbow: Topical Lidocaine 4% cream applied to wound bed prior to debridement Primary Wound Dressing: Wound #1 Right Elbow: Prisma Ag Secondary Dressing: Wound #1 Right Elbow: Boardered Foam Dressing - or telfa island Dressing Change Frequency: Wound #1 Right Elbow: Change dressing every other day. JACOBO, CEPERO ([id number]) Follow-up Appointments: Wound #1 Right Elbow: Return Appointment in 1 week. advised stockings bilaterally for chronic veinous insufficiency only really problamatic area was his right elbow. we dressed this with collagen,border foam he tells me he is haveing cataract surgery next week and won't be able to attend clinic, I expect that the wound on his elbow will be healed by that point and he can cancel his follow-up in 2 weeks Electronic Signature(s)  Signed: 11/24/2016 3:56:26 PM By: Linton Ham MD Entered By: Linton Ham on 11/24/2016 07:59:17 Justin Hancock ([id number]) -------------------------------------------------------------------------------- ROS/PFSH Details Patient Name: MESSIYAH, VOLMAR. Date of Service: 11/23/2016 8:00 AM Medical Record Number: [id number] Patient Account Number: [id number] Date of Birth/Sex: 10-19-1926 (83 y.o. Male) Treating RN: Montey Hora Primary Care Provider: [place] Pink Other Clinician: Referring Provider: Lujean Amel Treating Provider/Extender: Tito Dine in Treatment: 0 Information Obtained From Patient Wound History Do you  currently have one or more open woundso Yes How many open wounds do you currently haveo 1 Approximately how long have you had your woundso 1 month How have you been treating your wound(s) until nowo TCA, triple antibiotic ointment Has your wound(s) ever healed and then re-openedo No Have you had any lab work done in the past montho Yes Who ordered the lab work doneo PCP Have you tested positive for an antibiotic resistant organism (MRSA, No VRE)o Have you tested positive for osteomyelitis (bone infection)o No Have you had any tests for circulation on your legso Yes Who ordered the testo PCP Where was the test doneo Dr Lucky Cowboy Have you had other problems associated with your woundso Swelling Cardiovascular Complaints and Symptoms: Positive for: LE edema Medical History: Positive for: Coronary Artery Disease; Hypertension Past Medical History Notes: venous stasis, cardiomyopathy Constitutional Symptoms (General Health) Complaints and Symptoms: No Complaints or Symptoms Eyes Complaints and Symptoms: No Complaints or Symptoms Ear/Nose/Mouth/Throat CLAUDEL, BELKA ([id number]) Complaints and Symptoms: No Complaints or Symptoms Hematologic/Lymphatic Complaints and Symptoms: No Complaints or Symptoms Respiratory Complaints and Symptoms: No Complaints or Symptoms Medical History: Positive for: Sleep Apnea Negative for: Chronic Obstructive Pulmonary Disease (COPD) Gastrointestinal Complaints and Symptoms: No Complaints or Symptoms Endocrine Complaints and Symptoms: No Complaints or Symptoms Medical History: Positive for: Type II Diabetes Time with diabetes: 30 years Treated with: Oral agents Blood sugar tested every day: Yes Tested : Genitourinary Complaints and Symptoms: No Complaints or Symptoms Medical History: Past Medical History Notes: BPH Immunological Complaints and Symptoms: No Complaints or Symptoms Integumentary (Skin) Mccook, Clair E.  ([id number]) Complaints and Symptoms: No Complaints or Symptoms Musculoskeletal Complaints and Symptoms: No Complaints or Symptoms Neurologic Complaints and Symptoms: No Complaints or Symptoms Medical History: Past Medical History Notes: Bells Palsy Oncologic Medical History: Negative for: Received Chemotherapy; Received Radiation Past Medical History Notes: colon cancer - surgical treatment only Psychiatric Complaints and Symptoms: No Complaints or Symptoms Immunizations Pneumococcal Vaccine: Received Pneumococcal Vaccination: Yes Immunization Notes: up to date Family and Social History Former smoker - quit times 29 years; Marital Status - Married; Alcohol Use: Never; Drug Use: No History; Caffeine Use: Daily; Financial Concerns: No; Food, Clothing or Shelter Needs: No; Support System Lacking: No; Transportation Concerns: No; Advanced Directives: No; Patient does not want information on Advanced Directives Electronic Signature(s) Signed: 11/23/2016 4:59:35 PM By: Montey Hora Signed: 11/24/2016 3:56:26 PM By: Linton Ham MD Entered By: Montey Hora on 11/23/2016 08:28:13 Justin Hancock ([id number]) -------------------------------------------------------------------------------- [facility] Details Patient Name: Justin Hancock. Date of Service: 11/23/2016 Medical Record Number: [id number] Patient Account Number: [id number] Date of Birth/Sex: 1927-08-11 (83 y.o. Male) Treating RN: Montey Hora Primary Care Provider: [place] Pink Other Clinician: Referring Provider: Lujean Amel Treating Provider/Extender: Ricard Dillon Service Line: Outpatient Weeks in Treatment: 0 Diagnosis Coding ICD-10 Codes Code Description Chronic venous hypertension (idiopathic) with ulcer and inflammation of right lower I87.331 extremity L97.211 Non-pressure chronic ulcer of right calf limited to breakdown of skin S51.811D Laceration without foreign body of right forearm,  subsequent encounter E11.42 Type 2 diabetes mellitus with diabetic polyneuropathy Facility Procedures CPT4 Code Description: [id number] [facility] VISIT-LEV 3 EST PT Modifier: Quantity: 1 CPT4 Code Description: [id number] - DEB SUBQ TISSUE 20 SQ CM/< ICD-10 Description Diagnosis S51.811D Laceration without foreign body of right forearm, su Modifier: bsequent enc Quantity: 1 ounter Physician Procedures CPT4: Description Modifier Quantity Code [id number] - WC PHYS LEVEL 2 - NEW PT 1 ICD-10 Description Diagnosis I87.331 Chronic venous hypertension (idiopathic) with ulcer and inflammation of right lower extremity S51.811D Laceration without foreign  body of right forearm, subsequent encounter CPT4: [id number] - WC PHYS SUBQ TISS 20 SQ CM 1 ICD-10 Description Diagnosis S51.811D Laceration without foreign body of right forearm, subsequent encounter FLEMON, TROBAUGH ([id number]) Electronic Signature(s) Signed: 11/24/2016 3:56:26 PM By: Linton Ham MD Entered By: Linton Ham on 11/24/2016 08:00:12

## 2016-12-06 NOTE — Progress Notes (Signed)
12/07/2016 12:09 PM   Justin Hancock 10-15-1926 [id number]  Referring provider: Maryland Pink, MD [address] [facility] [place], [place] [address]  Chief Complaint  Patient presents with  . Benign Prostatic Hypertrophy    HPI: 83 yo WM with BPH with LU TS and a history of hematuria thought to be the result of a hypervascularity of the prostate who presents today for a 3 month follow up visit.  He completed a hematuria work up in 08/2016 with CT Urogram and cystoscopy.  No worrisome GU findings were seen on CT.  He was found to have a hypervascular prostate on cystoscopy.  He was recently started on finasteride in 07/2016.    Patient takes ASA on a prn basis.  He was taking quite a bit while he had the flu.  He is not taking the ASA at this time.    His IPSS score today is 18, which is moderate lower urinary tract symptomatology. He is mostly satisfied with his quality life due to his urinary symptoms.  His PVR is 96 mL.    His major complaints today are frequency, urgency, nocturia, incontinence, intermittency and three episodes of drops of blood in his urine.  He has had these symptoms for many years.  He denies any dysuria, hematuria or suprapubic pain.   He currently taking tamsulosin 0.4 mg daily and finasteride 5 mg daily.       He also denies any recent fevers, chills, nausea or vomiting.      IPSS    Row Name 12/08/16 1200         International Prostate Symptom Score   How often have you had the sensation of not emptying your bladder? Not at All     How often have you had to urinate less than every two hours? About half the time     How often have you found you stopped and started again several times when you urinated? Almost always     How often have you found it difficult to postpone urination? Almost always     How often have you had a weak urinary stream? About half the time     How often have you had to strain to start urination? Not at All     How many times did you typically get up at night to urinate? 2 Times     Total IPSS Score 18       Quality of Life due to urinary symptoms   If you were to spend the rest of your life with your urinary condition just the way it is now how would you feel about that? Mostly Satisfied        Score:  1-7 Mild 8-19 Moderate 20-35 Severe   PMH: Past Medical History:  Diagnosis Date  . Arthritis    handsd  . Balance problem    uses cane or walker  . Cancer ([place])   . Chronic kidney disease   . Coronary artery disease   . Diabetes mellitus without complication ([place])   . Heart murmur   . Heartburn   . HLD (hyperlipidemia)   . Hypertension   . Peripheral vascular disease ([place])   . Sleep apnea    no CPAP    Surgical History: Past Surgical History:  Procedure Laterality Date  . CATARACT EXTRACTION W/PHACO Right 11/24/2016   Procedure: CATARACT EXTRACTION PHACO AND INTRAOCULAR LENS PLACEMENT (IOC)  Right eye Diabetic  toric lens;  Surgeon: Leandrew Koyanagi, MD;  Location: [facility];  Service: Ophthalmology;  Laterality: Right;  Diabetic - oral meds Toric lens malyugin  . colon cancer surgery     x 2  . PERIPHERAL VASCULAR CATHETERIZATION N/A 11/27/2015   Procedure: Abdominal Aortogram w/Lower Extremity;  Surgeon: Algernon Huxley, MD;  Location: [place] [facility];  Service: Cardiovascular;  Laterality: N/A;  . PERIPHERAL VASCULAR CATHETERIZATION  11/27/2015   Procedure: Lower Extremity Intervention;  Surgeon: Algernon Huxley, MD;  Location: [place] [facility];  Service: Cardiovascular;;  . TONSILLECTOMY      Home Medications:  Allergies as of 12/07/2016      Reactions   Penicillins Hives   And fever      Medication List       Accurate as of 12/07/16 11:59 PM. Always use your most recent med list.          clopidogrel 75 MG tablet Commonly known as:  PLAVIX Take 75 mg by mouth daily.   finasteride 5 MG tablet Commonly known as:  PROSCAR Take 1 tablet (5 mg  total) by mouth daily.   furosemide 20 MG tablet Commonly known as:  LASIX Take by mouth.   hydrALAZINE 25 MG tablet Commonly known as:  APRESOLINE Take 75 mg by mouth 2 (two) times daily.   hydrochlorothiazide 12.5 MG tablet Commonly known as:  HYDRODIURIL Take 12.5 mg by mouth daily.   lisinopril 20 MG tablet Commonly known as:  PRINIVIL,ZESTRIL TAKE ONE TABLET BY MOUTH TWICE A DAY   loperamide 2 MG capsule Commonly known as:  IMODIUM Take by mouth as needed for diarrhea or loose stools.   lovastatin 20 MG tablet Commonly known as:  MEVACOR Take 20 mg by mouth 2 (two) times daily.   metFORMIN 500 MG tablet Commonly known as:  GLUCOPHAGE Take 500 mg by mouth 2 (two) times daily with a meal.   metoprolol succinate 50 MG 24 hr tablet Commonly known as:  TOPROL-XL Take 50 mg by mouth daily. Take with or immediately following a meal.   multivitamin tablet Take 1 tablet by mouth daily.   PRESERVISION AREDS 2+MULTI VIT PO Take by mouth.   tamsulosin 0.4 MG Caps capsule Commonly known as:  FLOMAX Take by mouth.   triamcinolone cream 0.1 % Commonly known as:  KENALOG Apply topically.   vitamin C 500 MG tablet Commonly known as:  ASCORBIC ACID Take by mouth.       Allergies:  Allergies  Allergen Reactions  . Penicillins Hives    And fever    Family History: Family History  Problem Relation Age of Onset  . Kidney Stones Father   . Prostate cancer Neg Hx   . Kidney disease Neg Hx   . Bladder Cancer Neg Hx     Social History:  reports that he quit smoking about 63 years ago. He has never used smokeless tobacco. He reports that he does not drink alcohol or use drugs.  ROS: UROLOGY Frequent Urination?: Yes Hard to postpone urination?: Yes Burning/pain with urination?: No Get up at night to urinate?: Yes Leakage of urine?: Yes Urine stream starts and stops?: Yes Trouble starting stream?: No Do you have to strain to urinate?: No Blood in urine?:  Yes Urinary tract infection?: No Sexually transmitted disease?: No Injury to kidneys or bladder?: No Painful intercourse?: No Weak stream?: No Erection problems?: Yes Penile pain?: No  Gastrointestinal Nausea?: No Vomiting?: No Indigestion/heartburn?: No Diarrhea?: Yes Constipation?: No  Constitutional Fever: No Night sweats?: No Weight loss?: No Fatigue?: No  Skin Skin rash/lesions?: No Itching?: No  Eyes Blurred vision?: No Double vision?: Yes  Ears/Nose/Throat Sore throat?: No Sinus problems?: No  Hematologic/Lymphatic Swollen glands?: No Easy bruising?: No  Cardiovascular Leg swelling?: No Chest pain?: No  Respiratory Cough?: No Shortness of breath?: No  Endocrine Excessive thirst?: No  Musculoskeletal Back pain?: No Joint pain?: No  Neurological Headaches?: No Dizziness?: No  Psychologic Depression?: No Anxiety?: No  Physical Exam: BP (!) 210/80 (BP Location: Left Arm, Patient Position: Sitting, Cuff Size: Large)   Pulse 75   Ht 6\' 1"  (1.854 m)   Wt 233 lb 12.8 oz (106.1 kg)   BMI 30.85 kg/m   Constitutional: Well nourished. Alert and oriented, No acute distress. HEENT: [place] AT, moist mucus membranes. Trachea midline, no masses. Cardiovascular: No clubbing, cyanosis, or edema. Respiratory: Normal respiratory effort, no increased work of breathing. GI: Abdomen is soft, non tender, non distended, no abdominal masses. Liver and spleen not palpable.  No hernias appreciated.  Stool sample for occult testing is not indicated.   GU: No CVA tenderness.  No bladder fullness or masses.  Patient with circumcised phallus.   Urethral meatus is patent.  No penile discharge. No penile lesions or rashes. Scrotum without lesions, cysts, rashes and/or edema.  Testicles are located scrotally bilaterally. No masses are appreciated in the testicles. Left and right epididymis are normal. Rectal: Patient with  normal sphincter tone. Anus and perineum without  scarring or rashes. No rectal masses are appreciated. Prostate is approximately 50 grams, no nodules are appreciated. Seminal vesicles are normal. Skin: No rashes, bruises or suspicious lesions. Lymph: No cervical or inguinal adenopathy. Neurologic: Grossly intact, no focal deficits, moving all 4 extremities. Psychiatric: Normal mood and affect.  Laboratory Data:  Lab Results  Component Value Date   CREATININE 1.36 (H) 08/16/2016    Urinalysis > 30 WBC's.  Many bacteria.  See EPIC.    Pertinent Imaging: CLINICAL DATA:  Multiple episodes of gross hematuria. Remote history of colon cancer surgery x 2. Cholecystectomy. Hernia repair.  EXAM: CT ABDOMEN AND PELVIS WITHOUT AND WITH CONTRAST  TECHNIQUE: Multidetector CT imaging of the abdomen and pelvis was performed following the standard protocol before and following the bolus administration of intravenous contrast.  CONTRAST:  158mL ISOVUE-300 IOPAMIDOL (ISOVUE-300) INJECTION 61%  COMPARISON:  12/02/2010 CT abdomen/ pelvis.  FINDINGS: Lower chest: Right lower lobe 5 mm pulmonary nodule (series 8/ image 3) and 5 mm medial left lower lobe pulmonary nodule (series 8/image 6) are stable back to 08/18/2010 and considered benign. Left main and 3 vessel coronary atherosclerosis.  Hepatobiliary: Normal liver with no liver mass. Cholecystectomy. Mild intrahepatic biliary ductal dilatation and common bile duct diameter 7 mm, within normal post cholecystectomy limits. No radiopaque choledocholithiasis.  Pancreas: There are cystic pancreatic head lesions measuring 2.3 cm and 1.3 cm (series 4/images 31 and 35), both stable since 08/18/2010, consistent with benign lesions, probably small pseudocysts. There is stable chronic mild dilatation of the ventral pancreatic duct (5 mm). Main pancreatic duct is normal caliber. Scattered calcifications in the pancreas are probably vascular.  Spleen: Normal size. No  mass.  Adrenals/Urinary Tract: No discrete right adrenal nodule. Left adrenal 1.6 cm adenoma is stable back to 08/18/2010. No renal stones. No hydronephrosis. Normal caliber ureters, with no ureteral stones. Exophytic simple 5.6 cm anterior interpolar right renal cyst. Partially exophytic simple 5.0 cm posterior interpolar right renal cyst. Exophytic simple 1.9 cm  posterior lower left renal cysts. On delayed imaging, there is no urothelial wall thickening and there are no filling defects in the opacified portions of the bilateral collecting systems or ureters, noting non opacification of portions of the proximal lumbar segment of the right ureter, limiting evaluation in these locations. No bladder stones, bladder wall thickening, bladder mass or bladder diverticulum.  Stomach/Bowel: Small hiatal hernia. Otherwise grossly normal stomach. Status post subtotal right hemicolectomy with intact appearing ileocolic anastomosis in the right abdomen. There a few mildly dilated proximal small bowel loops with air-fluid levels, measuring up to 3.4 cm diameter. No discrete small bowel caliber transition. No small bowel wall thickening. No anastomotic or large bowel wall thickening or pericolonic fat stranding.  Vascular/Lymphatic: Atherosclerotic nonaneurysmal abdominal aorta. Patent portal, splenic, hepatic and renal veins. No pathologically enlarged lymph nodes in the abdomen or pelvis.  Reproductive: Mildly enlarged prostate. Prostate dimensions 3.9 x 3.9 x 6.1 cm (volume = 49 cm^3).  Other: No pneumoperitoneum, ascites or focal fluid collection. New small fat containing umbilical hernia.  Musculoskeletal: No aggressive appearing focal osseous lesions. Marked thoracolumbar spondylosis.  IMPRESSION: 1. No urolithiasis.  No hydronephrosis. 2. Simple renal cysts. No suspicious renal cortical masses. No urothelial lesions, with limitations as described. 3. Mildly enlarged  prostate. 4. No evidence of local anastomotic tumor recurrence in the colon. No findings of metastatic disease in the abdomen or pelvis. 5. Mildly dilated proximal small bowel loops with air-fluid levels. No small bowel wall thickening or discrete caliber transition. Mild adynamic ileus is favored. Clinical correlation is necessary. 6. Additional findings include aortic atherosclerosis, left main and 3 vessel coronary atherosclerosis, new small fat containing umbilical hernia, small hiatal hernia stable left adrenal adenoma.   Electronically Signed   By: Ilona Sorrel M.D.   On: 08/31/2016 17:06  Results for DIMITRY, HOLSWORTH (MRN [id number]) as of 12/08/2016 12:09  Ref. Range 12/07/2016 15:44  Scan Result Unknown 96    Assessment & Plan:    1. History of hematuria  - completed a hematuria workup in 08/2016 and was found to have a hypervascular prostate  - had three isolated incidents of drops of blood in the urine  - UA is negative for hematuria  - urine is sent for culture  2. . BPH with LUTS             - Continue conservative management, avoiding bladder irritants and timed voiding's  - Most bothersome symptom is urgency - urine sent for culture             - Continue tamsulosin 0.4 mg daily and finasteride daily 5 mg daily; refills given             - RTC in 6 months for IPSS, PVR and exam   3. Urgency  - UA sent for culture  - follow up pending culture results  Return in about 6 months (around 06/09/2017) for IPSS, PVR and exam.  These notes generated with voice recognition software. I apologize for typographical errors.  Zara Council, [facility] [address], [place], [place] [address] [phone]

## 2016-12-07 ENCOUNTER — Ambulatory Visit: Payer: PPO | Admitting: Urology

## 2016-12-07 ENCOUNTER — Ambulatory Visit: Payer: PPO | Admitting: Internal Medicine

## 2016-12-07 ENCOUNTER — Encounter: Payer: Self-pay | Admitting: Urology

## 2016-12-07 VITALS — BP 210/80 | HR 75 | Ht 73.0 in | Wt 233.8 lb

## 2016-12-07 DIAGNOSIS — N138 Other obstructive and reflux uropathy: Secondary | ICD-10-CM | POA: Diagnosis not present

## 2016-12-07 DIAGNOSIS — R3915 Urgency of urination: Secondary | ICD-10-CM | POA: Diagnosis not present

## 2016-12-07 DIAGNOSIS — Z87448 Personal history of other diseases of urinary system: Secondary | ICD-10-CM

## 2016-12-07 DIAGNOSIS — N401 Enlarged prostate with lower urinary tract symptoms: Secondary | ICD-10-CM | POA: Diagnosis not present

## 2016-12-07 LAB — URINALYSIS, COMPLETE
BILIRUBIN UA: NEGATIVE
GLUCOSE, UA: NEGATIVE
NITRITE UA: NEGATIVE
SPEC GRAV UA: 1.025 (ref 1.005–1.030)
Urobilinogen, Ur: 0.2 mg/dL (ref 0.2–1.0)
pH, UA: 5.5 (ref 5.0–7.5)

## 2016-12-07 LAB — MICROSCOPIC EXAMINATION
Epithelial Cells (non renal): NONE SEEN /hpf (ref 0–10)
RBC MICROSCOPIC, UA: NONE SEEN /HPF (ref 0–?)
WBC, UA: >30 /hpf — ABNORMAL HIGH (ref 0–?)

## 2016-12-07 LAB — BLADDER SCAN AMB NON-IMAGING: SCAN RESULT: 96

## 2016-12-10 ENCOUNTER — Telehealth: Payer: Self-pay

## 2016-12-10 LAB — CULTURE, URINE COMPREHENSIVE

## 2016-12-10 NOTE — Telephone Encounter (Signed)
LMOM- most recent labs are negative.  

## 2016-12-10 NOTE — Telephone Encounter (Signed)
-----   Message from Nori Riis, PA-C sent at 12/10/2016 11:00 AM EDT ----- Please notify the patient that his urine culture was negative.

## 2016-12-14 ENCOUNTER — Ambulatory Visit: Payer: PPO | Admitting: Internal Medicine

## 2016-12-16 DIAGNOSIS — H2512 Age-related nuclear cataract, left eye: Secondary | ICD-10-CM | POA: Diagnosis not present

## 2016-12-16 NOTE — Discharge Instructions (Signed)
Cataract Surgery, Care After °Refer to this sheet in the next few weeks. These instructions provide you with information about caring for yourself after your procedure. Your health care provider may also give you more specific instructions. Your treatment has been planned according to current medical practices, but problems sometimes occur. Call your health care provider if you have any problems or questions after your procedure. °What can I expect after the procedure? °After the procedure, it is common to have: °· Itching. °· Discomfort. °· Fluid discharge. °· Sensitivity to light and to touch. °· Bruising. °Follow these instructions at home: °Eye Care  °· Check your eye every day for signs of infection. Watch for: °¨ Redness, swelling, or pain. °¨ Fluid, blood, or pus. °¨ Warmth. °¨ Bad smell. °Activity  °· Avoid strenuous activities, such as playing contact sports, for as long as told by your health care provider. °· Do not drive or operate heavy machinery until your health care provider approves. °· Do not bend or lift heavy objects . Bending increases pressure in the eye. You can walk, climb stairs, and do light household chores. °· Ask your health care provider when you can return to work. If you work in a dusty environment, you may be advised to wear protective eyewear for a period of time. °General instructions  °· Take or apply over-the-counter and prescription medicines only as told by your health care provider. This includes eye drops. °· Do not touch or rub your eyes. °· If you were given a protective shield, wear it as told by your health care provider. If you were not given a protective shield, wear sunglasses as told by your health care provider to protect your eyes. °· Keep the area around your eye clean and dry. Avoid swimming or allowing water to hit you directly in the face while showering until told by your health care provider. Keep soap and shampoo out of your eyes. °· Do not put a contact lens  into the affected eye or eyes until your health care provider approves. °· Keep all follow-up visits as told by your health care provider. This is important. °Contact a health care provider if: ° °· You have increased bruising around your eye. °· You have pain that is not helped with medicine. °· You have a fever. °· You have redness, swelling, or pain in your eye. °· You have fluid, blood, or pus coming from your incision. °· Your vision gets worse. °Get help right away if: °· You have sudden vision loss. °This information is not intended to replace advice given to you by your health care provider. Make sure you discuss any questions you have with your health care provider. °Document Released: 04/02/2005 Document Revised: 01/22/2016 Document Reviewed: 07/24/2015 °Elsevier Interactive Patient Education © 2017 [employer] ° ° ° ° °General Anesthesia, Adult, Care After °These instructions provide you with information about caring for yourself after your procedure. Your health care provider may also give you more specific instructions. Your treatment has been planned according to current medical practices, but problems sometimes occur. Call your health care provider if you have any problems or questions after your procedure. °What can I expect after the procedure? °After the procedure, it is common to have: °· Vomiting. °· A sore throat. °· Mental slowness. °It is common to feel: °· Nauseous. °· Cold or shivery. °· Sleepy. °· Tired. °· Sore or achy, even in parts of your body where you did not have surgery. °Follow these instructions at   home: °For at least 24 hours after the procedure:  °· Do not: °¨ Participate in activities where you could fall or become injured. °¨ Drive. °¨ Use heavy machinery. °¨ Drink alcohol. °¨ Take sleeping pills or medicines that cause drowsiness. °¨ Make important decisions or sign legal documents. °¨ Take care of children on your own. °· Rest. °Eating and drinking  °· If you vomit, drink  water, juice, or soup when you can drink without vomiting. °· Drink enough fluid to keep your urine clear or pale yellow. °· Make sure you have little or no nausea before eating solid foods. °· Follow the diet recommended by your health care provider. °General instructions  °· Have a responsible adult stay with you until you are awake and alert. °· Return to your normal activities as told by your health care provider. Ask your health care provider what activities are safe for you. °· Take over-the-counter and prescription medicines only as told by your health care provider. °· If you smoke, do not smoke without supervision. °· Keep all follow-up visits as told by your health care provider. This is important. °Contact a health care provider if: °· You continue to have nausea or vomiting at home, and medicines are not helpful. °· You cannot drink fluids or start eating again. °· You cannot urinate after 8-12 hours. °· You develop a skin rash. °· You have fever. °· You have increasing redness at the site of your procedure. °Get help right away if: °· You have difficulty breathing. °· You have chest pain. °· You have unexpected bleeding. °· You feel that you are having a life-threatening or urgent problem. °This information is not intended to replace advice given to you by your health care provider. Make sure you discuss any questions you have with your health care provider. °Document Released: 12/20/2000 Document Revised: 02/16/2016 Document Reviewed: 08/28/2015 °Elsevier Interactive Patient Education © 2017 [employer] ° °

## 2016-12-22 ENCOUNTER — Ambulatory Visit: Admission: RE | Admit: 2016-12-22 | Payer: PPO | Source: Ambulatory Visit | Admitting: Ophthalmology

## 2016-12-22 ENCOUNTER — Encounter: Admission: RE | Payer: Self-pay | Source: Ambulatory Visit

## 2016-12-22 SURGERY — PHACOEMULSIFICATION, CATARACT, WITH IOL INSERTION
Anesthesia: Topical | Laterality: Left

## 2016-12-24 ENCOUNTER — Encounter (INDEPENDENT_AMBULATORY_CARE_PROVIDER_SITE_OTHER): Payer: Self-pay

## 2016-12-24 ENCOUNTER — Ambulatory Visit (INDEPENDENT_AMBULATORY_CARE_PROVIDER_SITE_OTHER): Payer: Self-pay | Admitting: Vascular Surgery

## 2016-12-27 DIAGNOSIS — L578 Other skin changes due to chronic exposure to nonionizing radiation: Secondary | ICD-10-CM | POA: Diagnosis not present

## 2016-12-27 DIAGNOSIS — Z1283 Encounter for screening for malignant neoplasm of skin: Secondary | ICD-10-CM | POA: Diagnosis not present

## 2016-12-27 DIAGNOSIS — D044 Carcinoma in situ of skin of scalp and neck: Secondary | ICD-10-CM | POA: Diagnosis not present

## 2016-12-27 DIAGNOSIS — C4442 Squamous cell carcinoma of skin of scalp and neck: Secondary | ICD-10-CM | POA: Diagnosis not present

## 2016-12-27 DIAGNOSIS — Z85828 Personal history of other malignant neoplasm of skin: Secondary | ICD-10-CM | POA: Diagnosis not present

## 2016-12-27 DIAGNOSIS — L570 Actinic keratosis: Secondary | ICD-10-CM | POA: Diagnosis not present

## 2016-12-27 DIAGNOSIS — L820 Inflamed seborrheic keratosis: Secondary | ICD-10-CM | POA: Diagnosis not present

## 2016-12-27 DIAGNOSIS — L821 Other seborrheic keratosis: Secondary | ICD-10-CM | POA: Diagnosis not present

## 2016-12-27 DIAGNOSIS — D485 Neoplasm of uncertain behavior of skin: Secondary | ICD-10-CM | POA: Diagnosis not present

## 2016-12-29 ENCOUNTER — Ambulatory Visit: Payer: PPO | Admitting: Nurse Practitioner

## 2017-01-19 DIAGNOSIS — L851 Acquired keratosis [keratoderma] palmaris et plantaris: Secondary | ICD-10-CM | POA: Diagnosis not present

## 2017-01-19 DIAGNOSIS — E1140 Type 2 diabetes mellitus with diabetic neuropathy, unspecified: Secondary | ICD-10-CM | POA: Diagnosis not present

## 2017-01-19 DIAGNOSIS — B351 Tinea unguium: Secondary | ICD-10-CM | POA: Diagnosis not present

## 2017-01-21 NOTE — Discharge Instructions (Signed)
Cataract Surgery, Care After °Refer to this sheet in the next few weeks. These instructions provide you with information about caring for yourself after your procedure. Your health care provider may also give you more specific instructions. Your treatment has been planned according to current medical practices, but problems sometimes occur. Call your health care provider if you have any problems or questions after your procedure. °What can I expect after the procedure? °After the procedure, it is common to have: °· Itching. °· Discomfort. °· Fluid discharge. °· Sensitivity to light and to touch. °· Bruising. °Follow these instructions at home: °Eye Care  °· Check your eye every day for signs of infection. Watch for: °¨ Redness, swelling, or pain. °¨ Fluid, blood, or pus. °¨ Warmth. °¨ Bad smell. °Activity  °· Avoid strenuous activities, such as playing contact sports, for as long as told by your health care provider. °· Do not drive or operate heavy machinery until your health care provider approves. °· Do not bend or lift heavy objects . Bending increases pressure in the eye. You can walk, climb stairs, and do light household chores. °· Ask your health care provider when you can return to work. If you work in a dusty environment, you may be advised to wear protective eyewear for a period of time. °General instructions  °· Take or apply over-the-counter and prescription medicines only as told by your health care provider. This includes eye drops. °· Do not touch or rub your eyes. °· If you were given a protective shield, wear it as told by your health care provider. If you were not given a protective shield, wear sunglasses as told by your health care provider to protect your eyes. °· Keep the area around your eye clean and dry. Avoid swimming or allowing water to hit you directly in the face while showering until told by your health care provider. Keep soap and shampoo out of your eyes. °· Do not put a contact lens  into the affected eye or eyes until your health care provider approves. °· Keep all follow-up visits as told by your health care provider. This is important. °Contact a health care provider if: ° °· You have increased bruising around your eye. °· You have pain that is not helped with medicine. °· You have a fever. °· You have redness, swelling, or pain in your eye. °· You have fluid, blood, or pus coming from your incision. °· Your vision gets worse. °Get help right away if: °· You have sudden vision loss. °This information is not intended to replace advice given to you by your health care provider. Make sure you discuss any questions you have with your health care provider. °Document Released: 04/02/2005 Document Revised: 01/22/2016 Document Reviewed: 07/24/2015 °Elsevier Interactive Patient Education © 2017 [employer] ° ° ° ° °General Anesthesia, Adult, Care After °These instructions provide you with information about caring for yourself after your procedure. Your health care provider may also give you more specific instructions. Your treatment has been planned according to current medical practices, but problems sometimes occur. Call your health care provider if you have any problems or questions after your procedure. °What can I expect after the procedure? °After the procedure, it is common to have: °· Vomiting. °· A sore throat. °· Mental slowness. °It is common to feel: °· Nauseous. °· Cold or shivery. °· Sleepy. °· Tired. °· Sore or achy, even in parts of your body where you did not have surgery. °Follow these instructions at   home: °For at least 24 hours after the procedure:  °· Do not: °¨ Participate in activities where you could fall or become injured. °¨ Drive. °¨ Use heavy machinery. °¨ Drink alcohol. °¨ Take sleeping pills or medicines that cause drowsiness. °¨ Make important decisions or sign legal documents. °¨ Take care of children on your own. °· Rest. °Eating and drinking  °· If you vomit, drink  water, juice, or soup when you can drink without vomiting. °· Drink enough fluid to keep your urine clear or pale yellow. °· Make sure you have little or no nausea before eating solid foods. °· Follow the diet recommended by your health care provider. °General instructions  °· Have a responsible adult stay with you until you are awake and alert. °· Return to your normal activities as told by your health care provider. Ask your health care provider what activities are safe for you. °· Take over-the-counter and prescription medicines only as told by your health care provider. °· If you smoke, do not smoke without supervision. °· Keep all follow-up visits as told by your health care provider. This is important. °Contact a health care provider if: °· You continue to have nausea or vomiting at home, and medicines are not helpful. °· You cannot drink fluids or start eating again. °· You cannot urinate after 8-12 hours. °· You develop a skin rash. °· You have fever. °· You have increasing redness at the site of your procedure. °Get help right away if: °· You have difficulty breathing. °· You have chest pain. °· You have unexpected bleeding. °· You feel that you are having a life-threatening or urgent problem. °This information is not intended to replace advice given to you by your health care provider. Make sure you discuss any questions you have with your health care provider. °Document Released: 12/20/2000 Document Revised: 02/16/2016 Document Reviewed: 08/28/2015 °Elsevier Interactive Patient Education © 2017 [employer] ° °

## 2017-01-26 ENCOUNTER — Encounter
Admission: RE | Disposition: A | Discharge status: Home / Self Care | Payer: Self-pay | Source: Ambulatory Visit | Attending: Ophthalmology

## 2017-01-26 ENCOUNTER — Ambulatory Visit
Admission: RE | Admit: 2017-01-26 | Discharge: 2017-01-26 | Disposition: A | Discharge status: Home / Self Care | Payer: PPO | Source: Ambulatory Visit | Attending: Ophthalmology | Admitting: Ophthalmology

## 2017-01-26 ENCOUNTER — Ambulatory Visit: Payer: PPO | Admitting: Anesthesiology

## 2017-01-26 DIAGNOSIS — I10 Essential (primary) hypertension: Secondary | ICD-10-CM | POA: Insufficient documentation

## 2017-01-26 DIAGNOSIS — E1136 Type 2 diabetes mellitus with diabetic cataract: Secondary | ICD-10-CM | POA: Insufficient documentation

## 2017-01-26 DIAGNOSIS — K449 Diaphragmatic hernia without obstruction or gangrene: Secondary | ICD-10-CM | POA: Diagnosis not present

## 2017-01-26 DIAGNOSIS — H2512 Age-related nuclear cataract, left eye: Secondary | ICD-10-CM | POA: Diagnosis not present

## 2017-01-26 DIAGNOSIS — I2510 Atherosclerotic heart disease of native coronary artery without angina pectoris: Secondary | ICD-10-CM | POA: Insufficient documentation

## 2017-01-26 DIAGNOSIS — G4730 Sleep apnea, unspecified: Secondary | ICD-10-CM | POA: Insufficient documentation

## 2017-01-26 DIAGNOSIS — I739 Peripheral vascular disease, unspecified: Secondary | ICD-10-CM | POA: Diagnosis not present

## 2017-01-26 DIAGNOSIS — N289 Disorder of kidney and ureter, unspecified: Secondary | ICD-10-CM | POA: Insufficient documentation

## 2017-01-26 DIAGNOSIS — Z87891 Personal history of nicotine dependence: Secondary | ICD-10-CM | POA: Insufficient documentation

## 2017-01-26 HISTORY — DX: Benign prostatic hyperplasia without lower urinary tract symptoms: N40.0

## 2017-01-26 HISTORY — DX: Orthopnea: R06.01

## 2017-01-26 HISTORY — DX: Personal history of other diseases of the digestive system: Z87.19

## 2017-01-26 HISTORY — PX: CATARACT EXTRACTION W/PHACO: SHX586

## 2017-01-26 HISTORY — DX: Myoneural disorder, unspecified: G70.9

## 2017-01-26 LAB — GLUCOSE, CAPILLARY
Glucose-Capillary: 102 mg/dL — ABNORMAL HIGH (ref 65–99)
Glucose-Capillary: 103 mg/dL — ABNORMAL HIGH (ref 65–99)

## 2017-01-26 SURGERY — PHACOEMULSIFICATION, CATARACT, WITH IOL INSERTION
Anesthesia: Monitor Anesthesia Care | Site: Eye | Laterality: Left | Wound class: Clean

## 2017-01-26 MED ORDER — ACETAMINOPHEN 325 MG PO TABS: 325.0000 mg | ORAL_TABLET | ORAL | Status: DC | PRN

## 2017-01-26 MED ORDER — NA HYALUR & NA CHOND-NA HYALUR 0.4-0.35 ML IO KIT
PACK | INTRAOCULAR | Status: DC | PRN
  Administered 2017-01-26: 1 mL via INTRAOCULAR

## 2017-01-26 MED ORDER — MOXIFLOXACIN HCL 0.5 % OP SOLN
OPHTHALMIC | Status: DC | PRN
  Administered 2017-01-26: .3 mL via OPHTHALMIC

## 2017-01-26 MED ORDER — FENTANYL CITRATE (PF) 100 MCG/2ML IJ SOLN
INTRAMUSCULAR | Status: DC | PRN
  Administered 2017-01-26: 50 ug via INTRAVENOUS

## 2017-01-26 MED ORDER — ONDANSETRON HCL 4 MG/2ML IJ SOLN: 4.0000 mg | Freq: Once | INTRAMUSCULAR | Status: DC | PRN

## 2017-01-26 MED ORDER — LIDOCAINE HCL (PF) 2 % IJ SOLN
INTRAOCULAR | Status: DC | PRN
  Administered 2017-01-26: 1 mL via INTRAOCULAR

## 2017-01-26 MED ORDER — ACETAMINOPHEN 160 MG/5ML PO SOLN: 325.0000 mg | ORAL | Status: DC | PRN

## 2017-01-26 MED ORDER — MOXIFLOXACIN HCL 0.5 % OP SOLN
1.0000 [drp] | OPHTHALMIC | Status: DC | PRN
  Administered 2017-01-26 (×3): 1 [drp] via OPHTHALMIC

## 2017-01-26 MED ORDER — BRIMONIDINE TARTRATE-TIMOLOL 0.2-0.5 % OP SOLN
OPHTHALMIC | Status: DC | PRN
  Administered 2017-01-26: 1 [drp] via OPHTHALMIC

## 2017-01-26 MED ORDER — ARMC OPHTHALMIC DILATING DROPS
1.0000 "application " | OPHTHALMIC | Status: DC | PRN
  Administered 2017-01-26 (×3): 1 via OPHTHALMIC

## 2017-01-26 MED ORDER — BSS IO SOLN
INTRAOCULAR | Status: DC | PRN
  Administered 2017-01-26: 56 mL via OPHTHALMIC

## 2017-01-26 MED ORDER — MIDAZOLAM HCL 2 MG/2ML IJ SOLN
INTRAMUSCULAR | Status: DC | PRN
  Administered 2017-01-26: 2 mg via INTRAVENOUS

## 2017-01-26 SURGICAL SUPPLY — 26 items
AcrySof IQ astigmatism IOL 17.0D (Intraocular Lens) ×2 IMPLANT
CANNULA ANT/CHMB 27GA (MISCELLANEOUS) ×3 IMPLANT
CARTRIDGE ABBOTT (MISCELLANEOUS) IMPLANT
GLOVE SURG LX 7.5 STRW (GLOVE) ×2
GLOVE SURG LX STRL 7.5 STRW (GLOVE) ×1 IMPLANT
GLOVE SURG TRIUMPH 8.0 PF LTX (GLOVE) ×3 IMPLANT
GOWN STRL REUS W/ TWL LRG LVL3 (GOWN DISPOSABLE) ×2 IMPLANT
GOWN STRL REUS W/TWL LRG LVL3 (GOWN DISPOSABLE) ×6
MARKER SKIN DUAL TIP RULER LAB (MISCELLANEOUS) ×3 IMPLANT
NDL FILTER BLUNT 18X1 1/2 (NEEDLE) ×1 IMPLANT
NDL RETROBULBAR .5 NSTRL (NEEDLE) IMPLANT
NEEDLE FILTER BLUNT 18X 1/2SAF (NEEDLE) ×2
NEEDLE FILTER BLUNT 18X1 1/2 (NEEDLE) ×1 IMPLANT
PACK CATARACT BRASINGTON (MISCELLANEOUS) ×3 IMPLANT
PACK EYE AFTER SURG (MISCELLANEOUS) ×3 IMPLANT
PACK OPTHALMIC (MISCELLANEOUS) ×3 IMPLANT
RING MALYGIN 7.0 (MISCELLANEOUS) IMPLANT
SUT ETHILON 10-0 CS-B-6CS-B-6 (SUTURE)
SUT VICRYL  9 0 (SUTURE)
SUT VICRYL 9 0 (SUTURE) IMPLANT
SUTURE EHLN 10-0 CS-B-6CS-B-6 (SUTURE) IMPLANT
SYR 3ML LL SCALE MARK (SYRINGE) ×3 IMPLANT
SYR 5ML LL (SYRINGE) ×3 IMPLANT
SYR TB 1ML LUER SLIP (SYRINGE) ×3 IMPLANT
WATER STERILE IRR 250ML POUR (IV SOLUTION) ×3 IMPLANT
WIPE NON LINTING 3.25X3.25 (MISCELLANEOUS) ×3 IMPLANT

## 2017-01-26 NOTE — Op Note (Signed)
LOCATION:  [place]   PREOPERATIVE DIAGNOSIS:  Nuclear sclerotic cataract of the left eye.  H25.12  POSTOPERATIVE DIAGNOSIS:  Nuclear sclerotic cataract of the left eye.   PROCEDURE:  Phacoemulsification with Toric posterior chamber intraocular lens placement of the left eye.   LENS:  Implant Name Type Inv. Item Serial No. Manufacturer Lot No. LRB No. Used  AcrySof IQ astigmatism IOL 17.0D Intraocular Lens   [id number] ALCON   Left 1   [id number] 17.0 D Toric intraocular lens with 3.0 diopters of cylindrical power with axis orientation at 180 degrees.   ULTRASOUND TIME: 19 % of 1 minutes, 4 seconds.  CDE 12.2   SURGEON:  Wyonia Hough, MD   ANESTHESIA:  Topical with tetracaine drops and 2% Xylocaine jelly, augmented with 1% preservative-free intracameral lidocaine.  COMPLICATIONS:  None.   DESCRIPTION OF PROCEDURE:  The patient was identified in the holding room and transported to the operating suite and placed in the supine position under the operating microscope.  The left eye was identified as the operative eye, and it was prepped and draped in the usual sterile ophthalmic fashion.    A clear-corneal paracentesis incision was made at the 1:30 position.  0.5 ml of preservative-free 1% lidocaine was injected into the anterior chamber. The anterior chamber was filled with Viscoat.  A 2.4 millimeter near clear corneal incision was then made at the 10:30 position.  A cystotome and capsulorrhexis forceps were then used to make a curvilinear capsulorrhexis.  Hydrodissection and hydrodelineation were then performed using balanced salt solution.   Phacoemulsification was then used in stop and chop fashion to remove the lens, nucleus and epinucleus.  The remaining cortex was aspirated using the irrigation and aspiration handpiece.  Provisc viscoelastic was then placed into the capsular bag to distend it for lens placement.  The Verion digital marker was used to align the implant  at the intended axis.   A 17.0 diopter lens was then injected into the capsular bag.  It was rotated clockwise until the axis marks on the lens were approximately 15 degrees in the counterclockwise direction to the intended alignment.  The viscoelastic was aspirated from the eye using the irrigation aspiration handpiece.  Then, a Koch spatula through the sideport incision was used to rotate the lens in a clockwise direction until the axis markings of the intraocular lens were lined up with the Verion alignment.  Balanced salt solution was then used to hydrate the wounds. Vigamox 0.2 ml of a 1mg  per ml solution was injected into the anterior chamber for a dose of 0.2 mg of intracameral antibiotic at the completion of the case.    The eye was noted to have a physiologic pressure and there was no wound leak noted.   Timolol and Brimonidine drops were applied to the eye.  The patient was taken to the recovery room in stable condition having had no complications of anesthesia or surgery.  Justin Hancock 01/26/2017, 9:17 AM

## 2017-01-26 NOTE — Transfer of Care (Signed)
Immediate Anesthesia Transfer of Care Note  Patient: Justin Hancock  Procedure(s) Performed: Procedure(s) with comments: CATARACT EXTRACTION PHACO AND INTRAOCULAR LENS PLACEMENT (IOC) Left Complicated  Daibetic toric lens (Left) - Diabetic-oral meds Malyugin Toric Lens  Patient Location: PACU  Anesthesia Type: MAC  Level of Consciousness: awake, alert  and patient cooperative  Airway and Oxygen Therapy: Patient Spontanous Breathing and Patient connected to supplemental oxygen  Post-op Assessment: Post-op Vital signs reviewed, Patient's Cardiovascular Status Stable, Respiratory Function Stable, Patent Airway and No signs of Nausea or vomiting  Post-op Vital Signs: Reviewed and stable  Complications: No apparent anesthesia complications

## 2017-01-26 NOTE — Anesthesia Procedure Notes (Signed)
Procedure Name: MAC Performed by: Breckyn Ticas Pre-anesthesia Checklist: Patient identified, Emergency Drugs available, Suction available, Timeout performed and Patient being monitored Patient Re-evaluated:Patient Re-evaluated prior to inductionOxygen Delivery Method: Nasal cannula Placement Confirmation: positive ETCO2     

## 2017-01-26 NOTE — Anesthesia Preprocedure Evaluation (Signed)
Anesthesia Evaluation  Patient identified by MRN, date of birth, ID band Patient awake    Reviewed: Allergy & Precautions, H&P , NPO status , Patient's Chart, lab work & pertinent test results  Airway Mallampati: II  TM Distance: >3 FB Neck ROM: full    Dental  (+) Chipped   Pulmonary sleep apnea , former smoker,    Pulmonary exam normal        Cardiovascular hypertension, + CAD and + Peripheral Vascular Disease  Normal cardiovascular exam     Neuro/Psych    GI/Hepatic hiatal hernia,   Endo/Other  diabetes  Renal/GU Renal disease     Musculoskeletal   Abdominal   Peds  Hematology   Anesthesia Other Findings   Reproductive/Obstetrics                             Anesthesia Physical  Anesthesia Plan  ASA: III  Anesthesia Plan: MAC   Post-op Pain Management:    Induction:   Airway Management Planned:   Additional Equipment:   Intra-op Plan:   Post-operative Plan:   Informed Consent: I have reviewed the patients History and Physical, chart, labs and discussed the procedure including the risks, benefits and alternatives for the proposed anesthesia with the patient or authorized representative who has indicated his/her understanding and acceptance.     Plan Discussed with:   Anesthesia Plan Comments:         Anesthesia Quick Evaluation

## 2017-01-26 NOTE — Anesthesia Postprocedure Evaluation (Signed)
Anesthesia Post Note  Patient: Justin Hancock  Procedure(s) Performed: Procedure(s) (LRB): CATARACT EXTRACTION PHACO AND INTRAOCULAR LENS PLACEMENT (IOC) Left Complicated  Daibetic toric lens (Left)  Patient location during evaluation: PACU Anesthesia Type: MAC Level of consciousness: awake and alert Pain management: pain level controlled Vital Signs Assessment: post-procedure vital signs reviewed and stable Respiratory status: spontaneous breathing, nonlabored ventilation and respiratory function stable Cardiovascular status: stable Anesthetic complications: no    Veda Canning

## 2017-01-26 NOTE — H&P (Signed)
The History and Physical notes are on paper, have been signed, and are to be scanned. The patient remains stable and unchanged from the H&P.   Previous H&P reviewed, patient examined, and there are no changes.  Justin Hancock 01/26/2017 8:17 AM

## 2017-01-27 ENCOUNTER — Encounter: Payer: Self-pay | Admitting: Ophthalmology

## 2017-02-01 DIAGNOSIS — I10 Essential (primary) hypertension: Secondary | ICD-10-CM | POA: Diagnosis not present

## 2017-02-01 DIAGNOSIS — K529 Noninfective gastroenteritis and colitis, unspecified: Secondary | ICD-10-CM | POA: Diagnosis not present

## 2017-02-01 DIAGNOSIS — D649 Anemia, unspecified: Secondary | ICD-10-CM | POA: Diagnosis not present

## 2017-02-01 DIAGNOSIS — E119 Type 2 diabetes mellitus without complications: Secondary | ICD-10-CM | POA: Diagnosis not present

## 2017-02-01 DIAGNOSIS — R319 Hematuria, unspecified: Secondary | ICD-10-CM | POA: Diagnosis not present

## 2017-02-01 DIAGNOSIS — E785 Hyperlipidemia, unspecified: Secondary | ICD-10-CM | POA: Diagnosis not present

## 2017-02-02 DIAGNOSIS — E119 Type 2 diabetes mellitus without complications: Secondary | ICD-10-CM | POA: Diagnosis not present

## 2017-02-02 DIAGNOSIS — D649 Anemia, unspecified: Secondary | ICD-10-CM | POA: Diagnosis not present

## 2017-02-02 DIAGNOSIS — I10 Essential (primary) hypertension: Secondary | ICD-10-CM | POA: Diagnosis not present

## 2017-02-02 DIAGNOSIS — R319 Hematuria, unspecified: Secondary | ICD-10-CM | POA: Diagnosis not present

## 2017-02-02 DIAGNOSIS — E785 Hyperlipidemia, unspecified: Secondary | ICD-10-CM | POA: Diagnosis not present

## 2017-02-02 DIAGNOSIS — K529 Noninfective gastroenteritis and colitis, unspecified: Secondary | ICD-10-CM | POA: Diagnosis not present

## 2017-02-04 ENCOUNTER — Other Ambulatory Visit (INDEPENDENT_AMBULATORY_CARE_PROVIDER_SITE_OTHER): Payer: PPO

## 2017-02-04 ENCOUNTER — Encounter (INDEPENDENT_AMBULATORY_CARE_PROVIDER_SITE_OTHER): Payer: Self-pay | Admitting: Vascular Surgery

## 2017-02-04 ENCOUNTER — Other Ambulatory Visit (INDEPENDENT_AMBULATORY_CARE_PROVIDER_SITE_OTHER): Payer: Self-pay | Admitting: Vascular Surgery

## 2017-02-04 ENCOUNTER — Ambulatory Visit (INDEPENDENT_AMBULATORY_CARE_PROVIDER_SITE_OTHER): Payer: PPO | Admitting: Vascular Surgery

## 2017-02-04 VITALS — BP 142/67 | HR 65 | Resp 16 | Wt 228.0 lb

## 2017-02-04 DIAGNOSIS — E785 Hyperlipidemia, unspecified: Secondary | ICD-10-CM

## 2017-02-04 DIAGNOSIS — I701 Atherosclerosis of renal artery: Secondary | ICD-10-CM | POA: Diagnosis not present

## 2017-02-04 DIAGNOSIS — Z959 Presence of cardiac and vascular implant and graft, unspecified: Secondary | ICD-10-CM | POA: Diagnosis not present

## 2017-02-04 DIAGNOSIS — I10 Essential (primary) hypertension: Secondary | ICD-10-CM

## 2017-02-04 DIAGNOSIS — Z95820 Peripheral vascular angioplasty status with implants and grafts: Secondary | ICD-10-CM

## 2017-02-04 NOTE — Assessment & Plan Note (Signed)
blood pressure control important in reducing the progression of atherosclerotic disease. On appropriate oral medications.  

## 2017-02-04 NOTE — Progress Notes (Signed)
MRN : [id number]  Justin Hancock is a 83 y.o. (Jan 28, 1927) male who presents with chief complaint of  Chief Complaint  Patient presents with  . Follow-up  .  History of Present Illness: Patient returns today in follow up of Renal artery disease. He is about 4 years status post left renal artery stent placement. He has had a good response in his blood pressure is reported as currently well controlled and general. His renal function is stable as far as he knows. Since his last visit, he has lost his wife but is dealing with that well. His duplex today shows a patent left renal artery stent without elevated velocities. The velocities in the right renal artery are normal with no evidence of hemodynamically significant renal artery stenosis on duplex.  Current Outpatient Prescriptions  Medication Sig Dispense Refill  . clopidogrel (PLAVIX) 75 MG tablet Take 75 mg by mouth daily. am    . finasteride (PROSCAR) 5 MG tablet Take 1 tablet (5 mg total) by mouth daily. 90 tablet 4  . hydrALAZINE (APRESOLINE) 25 MG tablet Take 75 mg by mouth 3 (three) times daily. Am,lunch and pm    . hydrochlorothiazide (HYDRODIURIL) 12.5 MG tablet Take 12.5 mg by mouth daily. am    . lisinopril (PRINIVIL,ZESTRIL) 20 MG tablet TAKE ONE TABLET BY MOUTH TWICE A DAY    . loperamide (IMODIUM) 2 MG capsule Take 2 mg by mouth as needed for diarrhea or loose stools. Am and pm    . lovastatin (MEVACOR) 20 MG tablet Take 20 mg by mouth 2 (two) times daily. Am and bedtime    . metFORMIN (GLUCOPHAGE) 500 MG tablet Take 500 mg by mouth 4 (four) times daily. Am,lunch,dinner,pm    . metoprolol succinate (TOPROL-XL) 50 MG 24 hr tablet Take 50 mg by mouth 2 (two) times daily. Take with or immediately following a meal. /am and pm    . Multiple Vitamin (MULTIVITAMIN) tablet Take 1 tablet by mouth daily.    . Multiple Vitamins-Minerals (PRESERVISION AREDS 2+MULTI VIT PO) Take by mouth. Am and pm    . tamsulosin (FLOMAX) 0.4 MG CAPS  capsule Take by mouth. am    . triamcinolone cream (KENALOG) 0.1 % Apply topically.    . vitamin C (ASCORBIC ACID) 500 MG tablet Take by mouth.    . furosemide (LASIX) 20 MG tablet Take 20 mg by mouth. am     No current facility-administered medications for this visit.     Past Medical History:  Diagnosis Date  . Arthritis    handsd  . Balance problem    uses cane or walker  . BPH (benign prostatic hyperplasia)   . Cancer ([place])    colon  . Chronic kidney disease 2015   renal stentx1  . Coronary artery disease    multiple stentsx7  . Diabetes mellitus without complication ([place])   . Heart murmur   . Heartburn   . History of hiatal hernia   . HLD (hyperlipidemia)   . Hypertension   . Neuromuscular disorder ([facility])    neuropathy  . Peripheral vascular disease ([place])   . Sleep apnea    no CPAP  . Sleeps in sitting position due to orthopnea     Past Surgical History:  Procedure Laterality Date  . ANKLE FRACTURE SURGERY    . APPENDECTOMY    . CATARACT EXTRACTION W/PHACO Right 11/24/2016   Procedure: CATARACT EXTRACTION PHACO AND INTRAOCULAR LENS PLACEMENT (IOC)  Right eye Diabetic  toric lens;  Surgeon: Leandrew Koyanagi, MD;  Location: [facility];  Service: Ophthalmology;  Laterality: Right;  Diabetic - oral meds Toric lens malyugin  . CATARACT EXTRACTION W/PHACO Left 01/26/2017   Procedure: CATARACT EXTRACTION PHACO AND INTRAOCULAR LENS PLACEMENT (IOC) Left Complicated  Daibetic toric lens;  Surgeon: Leandrew Koyanagi, MD;  Location: [facility];  Service: Ophthalmology;  Laterality: Left;  Diabetic-oral meds Malyugin Toric Lens  . CHOLECYSTECTOMY    . colon cancer surgery     x 2/ resection  . HERNIA REPAIR    . LEG SURGERY     fracture  . PERIPHERAL VASCULAR CATHETERIZATION N/A 11/27/2015   Procedure: Abdominal Aortogram w/Lower Extremity;  Surgeon: Algernon Huxley, MD;  Location: [facility];  Service: Cardiovascular;  Laterality: N/A;  .  PERIPHERAL VASCULAR CATHETERIZATION  11/27/2015   Procedure: Lower Extremity Intervention;  Surgeon: Algernon Huxley, MD;  Location: [facility];  Service: Cardiovascular;;  . TONSILLECTOMY      Social History Social History  Substance Use Topics  . Smoking status: Former Smoker    Quit date: 09/27/1953  . Smokeless tobacco: Never Used  . Alcohol use No  Widower  Family History Family History  Problem Relation Age of Onset  . Kidney Stones Father   . Prostate cancer Neg Hx   . Kidney disease Neg Hx   . Bladder Cancer Neg Hx     Allergies  Allergen Reactions  . Penicillins Hives    And fever     REVIEW OF SYSTEMS (Negative unless checked)  Constitutional: [] Weight loss  [] Fever  [] Chills Cardiac: [] Chest pain   [] Chest pressure   [] Palpitations   [] Shortness of breath when laying flat   [] Shortness of breath at rest   [] Shortness of breath with exertion. Vascular:  [] Pain in legs with walking   [] Pain in legs at rest   [] Pain in legs when laying flat   [] Claudication   [] Pain in feet when walking  [] Pain in feet at rest  [] Pain in feet when laying flat   [] History of DVT   [] Phlebitis   [x] Swelling in legs   [] Varicose veins   [] Non-healing ulcers Pulmonary:   [] Uses home oxygen   [] Productive cough   [] Hemoptysis   [] Wheeze  [] COPD   [] Asthma Neurologic:  [] Dizziness  [] Blackouts   [] Seizures   [] History of stroke   [] History of TIA  [] Aphasia   [] Temporary blindness   [] Dysphagia   [] Weakness or numbness in arms   [] Weakness or numbness in legs Musculoskeletal:  [x] Arthritis   [] Joint swelling   [] Joint pain   [] Low back pain Hematologic:  [] Easy bruising  [] Easy bleeding   [] Hypercoagulable state   [] Anemic   Gastrointestinal:  [] Blood in stool   [] Vomiting blood  [] Gastroesophageal reflux/heartburn   [] Abdominal pain Genitourinary:  [x] Chronic kidney disease   [] Difficult urination  [] Frequent urination  [] Burning with urination   [] Hematuria Skin:  [] Rashes   [] Ulcers    [] Wounds Psychological:  [] History of anxiety   []  History of major depression.  Physical Examination  BP (!) 142/67   Pulse 65   Resp 16   Wt 228 lb (103.4 kg)   BMI 30.08 kg/m  Gen:  WD/WN, NAD. Appears younger than stated age Head: [place]/AT, No temporalis wasting. Ear/Nose/Throat: Hearing grossly intact, nares w/o erythema or drainage, trachea midline Eyes: Conjunctiva clear. Sclera non-icteric Neck: Supple.  No JVD.  Pulmonary:  Good air movement, no use of accessory  muscles.  Cardiac: RRR, normal S1, S2 Vascular:  Vessel Right Left  Radial Palpable Palpable                                   Gastrointestinal: soft, non-tender/non-distended.  Musculoskeletal: M/S 5/5 throughout.  No deformity or atrophy. Walks with a cane Neurologic: Sensation grossly intact in extremities.  Symmetrical.  Speech is fluent.  Psychiatric: Judgment intact, Mood & affect appropriate for pt's clinical situation. Dermatologic: No rashes or ulcers noted.  No cellulitis or open wounds.       Labs Recent Results (from the past 2160 hour(s))  Glucose, capillary     Status: Abnormal   Collection Time: 11/24/16  6:57 AM  Result Value Ref Range   Glucose-Capillary 114 (H) 65 - 99 mg/dL  Glucose, capillary     Status: Abnormal   Collection Time: 11/24/16  9:07 AM  Result Value Ref Range   Glucose-Capillary 117 (H) 65 - 99 mg/dL  Urinalysis, Complete     Status: Abnormal   Collection Time: 12/07/16  3:36 PM  Result Value Ref Range   Specific Gravity, UA 1.025 1.005 - 1.030   pH, UA 5.5 5.0 - 7.5   Color, UA Yellow Yellow   Appearance Ur Cloudy (A) Clear   Leukocytes, UA 1+ (A) Negative   Protein, UA 2+ (A) Negative/Trace   Glucose, UA Negative Negative   Ketones, UA Trace (A) Negative   RBC, UA Trace (A) Negative   Bilirubin, UA Negative Negative   Urobilinogen, Ur 0.2 0.2 - 1.0 mg/dL   Nitrite, UA Negative Negative   Microscopic Examination See below:   Microscopic Examination      Status: Abnormal   Collection Time: 12/07/16  3:36 PM  Result Value Ref Range   WBC, UA >30 (H) 0 - 5 /hpf   RBC, UA None seen 0 - 2 /hpf   Epithelial Cells (non renal) None seen 0 - 10 /hpf   Bacteria, UA Many (A) None seen/Few  Bladder Scan (Post Void Residual) in office     Status: None   Collection Time: 12/07/16  3:44 PM  Result Value Ref Range   Scan Result 96   CULTURE, URINE COMPREHENSIVE     Status: None   Collection Time: 12/07/16  4:11 PM  Result Value Ref Range   Urine Culture, Comprehensive Final report    Result 1 Comment     Comment: Mixed urogenital flora 10,000-25,000 colony forming units per mL   Glucose, capillary     Status: Abnormal   Collection Time: 01/26/17  7:56 AM  Result Value Ref Range   Glucose-Capillary 102 (H) 65 - 99 mg/dL  Glucose, capillary     Status: Abnormal   Collection Time: 01/26/17  9:23 AM  Result Value Ref Range   Glucose-Capillary 103 (H) 65 - 99 mg/dL    Radiology No results found.    Assessment/Plan  Essential hypertension blood pressure control important in reducing the progression of atherosclerotic disease. On appropriate oral medications.   Hyperlipidemia lipid control important in reducing the progression of atherosclerotic disease. Continue statin therapy   RAS (renal artery stenosis) ([place]) His duplex today shows a patent left renal artery stent without elevated velocities. The velocities in the right renal artery are normal with no evidence of hemodynamically significant renal artery stenosis on duplex. He is now about 4 years status post left renal artery stent placement.  We will check this on an annual basis this point. He will contact our office with worsening renal function or blood pressure issues in the interim. He will continue his antiplatelet and statin therapy.    Leotis Pain, MD  02/04/2017 10:00 AM    This note was created with Dragon medical transcription system.  Any errors from dictation are  purely unintentional

## 2017-02-04 NOTE — Assessment & Plan Note (Signed)
lipid control important in reducing the progression of atherosclerotic disease. Continue statin therapy  

## 2017-02-04 NOTE — Assessment & Plan Note (Signed)
His duplex today shows a patent left renal artery stent without elevated velocities. The velocities in the right renal artery are normal with no evidence of hemodynamically significant renal artery stenosis on duplex. He is now about 4 years status post left renal artery stent placement. We will check this on an annual basis this point. He will contact our office with worsening renal function or blood pressure issues in the interim. He will continue his antiplatelet and statin therapy.

## 2017-02-28 DIAGNOSIS — D1800 Hemangioma unspecified site: Secondary | ICD-10-CM | POA: Diagnosis not present

## 2017-02-28 DIAGNOSIS — L820 Inflamed seborrheic keratosis: Secondary | ICD-10-CM | POA: Diagnosis not present

## 2017-02-28 DIAGNOSIS — D692 Other nonthrombocytopenic purpura: Secondary | ICD-10-CM | POA: Diagnosis not present

## 2017-02-28 DIAGNOSIS — L578 Other skin changes due to chronic exposure to nonionizing radiation: Secondary | ICD-10-CM | POA: Diagnosis not present

## 2017-02-28 DIAGNOSIS — Z1283 Encounter for screening for malignant neoplasm of skin: Secondary | ICD-10-CM | POA: Diagnosis not present

## 2017-02-28 DIAGNOSIS — L821 Other seborrheic keratosis: Secondary | ICD-10-CM | POA: Diagnosis not present

## 2017-02-28 DIAGNOSIS — Z85828 Personal history of other malignant neoplasm of skin: Secondary | ICD-10-CM | POA: Diagnosis not present

## 2017-02-28 DIAGNOSIS — L570 Actinic keratosis: Secondary | ICD-10-CM | POA: Diagnosis not present

## 2017-03-25 DIAGNOSIS — B351 Tinea unguium: Secondary | ICD-10-CM | POA: Diagnosis not present

## 2017-03-25 DIAGNOSIS — E1140 Type 2 diabetes mellitus with diabetic neuropathy, unspecified: Secondary | ICD-10-CM | POA: Diagnosis not present

## 2017-03-25 DIAGNOSIS — L851 Acquired keratosis [keratoderma] palmaris et plantaris: Secondary | ICD-10-CM | POA: Diagnosis not present

## 2017-04-27 DIAGNOSIS — M7752 Other enthesopathy of left foot: Secondary | ICD-10-CM | POA: Diagnosis not present

## 2017-04-27 DIAGNOSIS — M7751 Other enthesopathy of right foot: Secondary | ICD-10-CM | POA: Diagnosis not present

## 2017-04-27 DIAGNOSIS — Q6689 Other  specified congenital deformities of feet: Secondary | ICD-10-CM | POA: Diagnosis not present

## 2017-04-28 DIAGNOSIS — M9905 Segmental and somatic dysfunction of pelvic region: Secondary | ICD-10-CM | POA: Diagnosis not present

## 2017-04-28 DIAGNOSIS — M791 Myalgia: Secondary | ICD-10-CM | POA: Diagnosis not present

## 2017-04-28 DIAGNOSIS — M25551 Pain in right hip: Secondary | ICD-10-CM | POA: Diagnosis not present

## 2017-04-28 DIAGNOSIS — M9904 Segmental and somatic dysfunction of sacral region: Secondary | ICD-10-CM | POA: Diagnosis not present

## 2017-05-04 DIAGNOSIS — R0602 Shortness of breath: Secondary | ICD-10-CM | POA: Diagnosis not present

## 2017-05-04 DIAGNOSIS — N182 Chronic kidney disease, stage 2 (mild): Secondary | ICD-10-CM | POA: Diagnosis not present

## 2017-05-04 DIAGNOSIS — I209 Angina pectoris, unspecified: Secondary | ICD-10-CM | POA: Diagnosis not present

## 2017-05-04 DIAGNOSIS — I2510 Atherosclerotic heart disease of native coronary artery without angina pectoris: Secondary | ICD-10-CM | POA: Diagnosis not present

## 2017-05-04 DIAGNOSIS — I493 Ventricular premature depolarization: Secondary | ICD-10-CM | POA: Diagnosis not present

## 2017-05-04 DIAGNOSIS — I5022 Chronic systolic (congestive) heart failure: Secondary | ICD-10-CM | POA: Diagnosis not present

## 2017-05-04 DIAGNOSIS — M1990 Unspecified osteoarthritis, unspecified site: Secondary | ICD-10-CM | POA: Diagnosis not present

## 2017-05-04 DIAGNOSIS — E784 Other hyperlipidemia: Secondary | ICD-10-CM | POA: Diagnosis not present

## 2017-05-04 DIAGNOSIS — G4733 Obstructive sleep apnea (adult) (pediatric): Secondary | ICD-10-CM | POA: Diagnosis not present

## 2017-05-04 DIAGNOSIS — E669 Obesity, unspecified: Secondary | ICD-10-CM | POA: Diagnosis not present

## 2017-06-02 DIAGNOSIS — Z85828 Personal history of other malignant neoplasm of skin: Secondary | ICD-10-CM | POA: Diagnosis not present

## 2017-06-02 DIAGNOSIS — L578 Other skin changes due to chronic exposure to nonionizing radiation: Secondary | ICD-10-CM | POA: Diagnosis not present

## 2017-06-02 DIAGNOSIS — L570 Actinic keratosis: Secondary | ICD-10-CM | POA: Diagnosis not present

## 2017-06-09 ENCOUNTER — Encounter: Payer: Self-pay | Admitting: Urology

## 2017-06-09 ENCOUNTER — Ambulatory Visit: Payer: PPO | Admitting: Urology

## 2017-07-06 DIAGNOSIS — L851 Acquired keratosis [keratoderma] palmaris et plantaris: Secondary | ICD-10-CM | POA: Diagnosis not present

## 2017-07-06 DIAGNOSIS — B351 Tinea unguium: Secondary | ICD-10-CM | POA: Diagnosis not present

## 2017-07-06 DIAGNOSIS — E1140 Type 2 diabetes mellitus with diabetic neuropathy, unspecified: Secondary | ICD-10-CM | POA: Diagnosis not present

## 2017-07-06 DIAGNOSIS — L97521 Non-pressure chronic ulcer of other part of left foot limited to breakdown of skin: Secondary | ICD-10-CM | POA: Diagnosis not present

## 2017-07-06 DIAGNOSIS — Z794 Long term (current) use of insulin: Secondary | ICD-10-CM | POA: Diagnosis not present

## 2017-07-14 DIAGNOSIS — L570 Actinic keratosis: Secondary | ICD-10-CM | POA: Diagnosis not present

## 2017-07-14 DIAGNOSIS — L578 Other skin changes due to chronic exposure to nonionizing radiation: Secondary | ICD-10-CM | POA: Diagnosis not present

## 2017-07-18 DIAGNOSIS — I2510 Atherosclerotic heart disease of native coronary artery without angina pectoris: Secondary | ICD-10-CM | POA: Diagnosis not present

## 2017-07-18 DIAGNOSIS — L97909 Non-pressure chronic ulcer of unspecified part of unspecified lower leg with unspecified severity: Secondary | ICD-10-CM | POA: Diagnosis not present

## 2017-07-18 DIAGNOSIS — N182 Chronic kidney disease, stage 2 (mild): Secondary | ICD-10-CM | POA: Diagnosis not present

## 2017-07-18 DIAGNOSIS — R0609 Other forms of dyspnea: Secondary | ICD-10-CM | POA: Diagnosis not present

## 2017-07-18 DIAGNOSIS — I208 Other forms of angina pectoris: Secondary | ICD-10-CM | POA: Diagnosis not present

## 2017-07-18 DIAGNOSIS — G4730 Sleep apnea, unspecified: Secondary | ICD-10-CM | POA: Diagnosis not present

## 2017-07-18 DIAGNOSIS — I10 Essential (primary) hypertension: Secondary | ICD-10-CM | POA: Diagnosis not present

## 2017-07-18 DIAGNOSIS — I878 Other specified disorders of veins: Secondary | ICD-10-CM | POA: Diagnosis not present

## 2017-07-18 DIAGNOSIS — I255 Ischemic cardiomyopathy: Secondary | ICD-10-CM | POA: Diagnosis not present

## 2017-07-18 DIAGNOSIS — E782 Mixed hyperlipidemia: Secondary | ICD-10-CM | POA: Diagnosis not present

## 2017-07-25 DIAGNOSIS — H353132 Nonexudative age-related macular degeneration, bilateral, intermediate dry stage: Secondary | ICD-10-CM | POA: Diagnosis not present

## 2017-07-26 ENCOUNTER — Encounter: Payer: PPO | Attending: Internal Medicine | Admitting: Internal Medicine

## 2017-07-26 DIAGNOSIS — L97211 Non-pressure chronic ulcer of right calf limited to breakdown of skin: Secondary | ICD-10-CM | POA: Insufficient documentation

## 2017-07-26 DIAGNOSIS — Z7984 Long term (current) use of oral hypoglycemic drugs: Secondary | ICD-10-CM | POA: Insufficient documentation

## 2017-07-26 DIAGNOSIS — E1151 Type 2 diabetes mellitus with diabetic peripheral angiopathy without gangrene: Secondary | ICD-10-CM | POA: Insufficient documentation

## 2017-07-26 DIAGNOSIS — I872 Venous insufficiency (chronic) (peripheral): Secondary | ICD-10-CM | POA: Diagnosis not present

## 2017-07-26 DIAGNOSIS — I739 Peripheral vascular disease, unspecified: Secondary | ICD-10-CM | POA: Diagnosis not present

## 2017-07-26 DIAGNOSIS — L97221 Non-pressure chronic ulcer of left calf limited to breakdown of skin: Secondary | ICD-10-CM | POA: Diagnosis not present

## 2017-07-26 DIAGNOSIS — I87333 Chronic venous hypertension (idiopathic) with ulcer and inflammation of bilateral lower extremity: Secondary | ICD-10-CM | POA: Diagnosis not present

## 2017-07-26 DIAGNOSIS — Z880 Allergy status to penicillin: Secondary | ICD-10-CM | POA: Insufficient documentation

## 2017-07-26 DIAGNOSIS — E11622 Type 2 diabetes mellitus with other skin ulcer: Secondary | ICD-10-CM | POA: Diagnosis not present

## 2017-07-26 DIAGNOSIS — Z87891 Personal history of nicotine dependence: Secondary | ICD-10-CM | POA: Insufficient documentation

## 2017-07-26 DIAGNOSIS — L97812 Non-pressure chronic ulcer of other part of right lower leg with fat layer exposed: Secondary | ICD-10-CM | POA: Diagnosis not present

## 2017-07-26 DIAGNOSIS — L97212 Non-pressure chronic ulcer of right calf with fat layer exposed: Secondary | ICD-10-CM | POA: Diagnosis not present

## 2017-07-29 NOTE — Progress Notes (Signed)
CHAKA, JEFFERYS ([id number]) Visit Report for 07/26/2017 Allergy List Details Patient Name: Justin Hancock, Justin Hancock. Date of Service: 07/26/2017 10:30 AM Medical Record Number: [id number] Patient Account Number: [id number] Date of Birth/Sex: [birth year] (83 y.o. Male) Treating RN: Ahmed Prima Primary Care Wenona [place]: [place] Pink Other Clinician: Referring Caylon Saine: Lujean Amel Treating Janilah Hojnacki/Extender: Ricard Dillon Weeks in Treatment: 0 Allergies Active Allergies penicillin Allergy Notes Electronic Signature(s) Signed: 07/27/2017 4:35:14 PM By: Alric Quan Entered By: Alric Quan on 07/26/2017 10:53:44 Justin Hancock ([id number]) -------------------------------------------------------------------------------- Arrival Information Details Patient Name: Justin Hancock Date of Service: 07/26/2017 10:30 AM Medical Record Number: [id number] Patient Account Number: [id number] Date of Birth/Sex: 07/25/1927 (83 y.o. Male) Treating RN: Ahmed Prima Primary Care Kia Stavros: [place] Pink Other Clinician: Referring Thalya Fouche: Lujean Amel Treating Humbert Morozov/Extender: Tito Dine in Treatment: 0 Visit Information Patient Arrived: Justin Hancock Time: 10:47 Accompanied By: self Transfer Assistance: EasyPivot Patient Lift Patient Identification Verified: Yes Secondary Verification Process Yes Completed: Patient Requires Transmission-Based No Precautions: Patient Has Alerts: Yes Patient Alerts: Patient on Blood Thinner DM II Plavix Electronic Signature(s) Signed: 07/27/2017 4:35:14 PM By: Alric Quan Entered By: Alric Quan on 07/26/2017 10:50:08 Justin Hancock ([id number]) -------------------------------------------------------------------------------- Clinic Level of Care Assessment Details Patient Name: Justin Hancock. Date of Service: 07/26/2017 10:30 AM Medical Record Number: [id number] Patient Account Number:  [id number] Date of Birth/Sex: Oct 18, 1926 (83 y.o. Male) Treating RN: Ahmed Prima Primary Care Sussan Meter: [place] Pink Other Clinician: Referring Bevely Hackbart: Lujean Amel Treating Chancy Claros/Extender: Tito Dine in Treatment: 0 Clinic Level of Care Assessment Items TOOL 1 Quantity Score X - Use when EandM and Procedure is performed on INITIAL visit 1 0 ASSESSMENTS - Nursing Assessment / Reassessment X - General Physical Exam (combine w/ comprehensive assessment (listed just below) when 1 20 performed on new pt. evals) X- 1 25 Comprehensive Assessment (HX, ROS, Risk Assessments, Wounds Hx, etc.) ASSESSMENTS - Wound and Skin Assessment / Reassessment []  - Dermatologic / Skin Assessment (not related to wound area) 0 ASSESSMENTS - Ostomy and/or Continence Assessment and Care []  - Incontinence Assessment and Management 0 []  - 0 Ostomy Care Assessment and Management (repouching, etc.) PROCESS - Coordination of Care []  - Simple Patient / Family Education for ongoing care 0 X- 1 20 Complex (extensive) Patient / Family Education for ongoing care X- 1 10 Staff obtains Programmer, systems, Records, Test Results / Process Orders []  - 0 Staff telephones HHA, Nursing Homes / Clarify orders / etc []  - 0 Routine Transfer to another Facility (non-emergent condition) []  - 0 Routine Hospital Admission (non-emergent condition) X- 1 15 New Admissions / Biomedical engineer / Ordering NPWT, Apligraf, etc. []  - 0 Emergency Hospital Admission (emergent condition) PROCESS - Special Needs []  - Pediatric / Minor Patient Management 0 []  - 0 Isolation Patient Management []  - 0 Hearing / Language / Visual special needs []  - 0 Assessment of Community assistance (transportation, D/C planning, etc.) []  - 0 Additional assistance / Altered mentation []  - 0 Support Surface(s) Assessment (bed, cushion, seat, etc.) Justin Hancock, Justin E. ([id number]) INTERVENTIONS - Miscellaneous []  - External ear  exam 0 []  - 0 Patient Transfer (multiple staff / Civil Service fast streamer / Similar devices) []  - 0 Simple Staple / Suture removal (25 or less) []  - 0 Complex Staple / Suture removal (26 or more) []  - 0 Hypo/Hyperglycemic Management (do not check if billed separately) X- 1 15 Ankle / Brachial Index (ABI) - do not check if billed separately Has the patient been seen at the  hospital within the last three years: Yes Total Score: 105 Level Of Care: New/Established - Level 3 Electronic Signature(s) Signed: 07/27/2017 4:35:14 PM By: Alric Quan Entered By: Alric Quan on 07/26/2017 13:34:02 Justin Hancock ([id number]) -------------------------------------------------------------------------------- Encounter Discharge Information Details Patient Name: Justin Hancock, Justin Hancock. Date of Service: 07/26/2017 10:30 AM Medical Record Number: [id number] Patient Account Number: [id number] Date of Birth/Sex: [birth year] (83 y.o. Male) Treating RN: Ahmed Prima Primary Care Kayra Crowell: [place] Other Clinician: Referring Brick Ketcher: Lujean Amel Treating Aniello Christopoulos/Extender: Tito Dine in Treatment: 0 Encounter Discharge Information Items Schedule Follow-up Appointment: No Medication Reconciliation completed and No provided to Patient/Care Sianni Cloninger: Provided on Clinical Summary of Care: 07/26/2017 Form Type Recipient Paper Patient VZ Electronic Signature(s) Signed: 07/27/2017 9:28:38 AM By: Ruthine Dose Entered By: Ruthine Dose on 07/26/2017 11:56:34 Justin Hancock ([id number]) -------------------------------------------------------------------------------- Lower Extremity Assessment Details Patient Name: Justin Hancock. Date of Service: 07/26/2017 10:30 AM Medical Record Number: [id number] Patient Account Number: [id number] Date of Birth/Sex: 08/07/1927 (83 y.o. Male) Treating RN: Ahmed Prima Primary Care Moncerrat Burnstein: [place] Pink Other Clinician: Referring Tevis Conger:  Lujean Amel Treating Brayten Komar/Extender: Tito Dine in Treatment: 0 Vascular Assessment Pulses: Dorsalis Pedis Palpable: [Left:No] [Right:No] Doppler Audible: [Left:Yes] [Right:Yes] Posterior Tibial Palpable: [Left:No] [Right:No] Doppler Audible: [Left:Yes] [Right:Yes] Extremity colors, hair growth, and conditions: Extremity Color: [Left:Hyperpigmented] [Right:Hyperpigmented] Temperature of Extremity: [Left:Warm] [Right:Warm] Capillary Refill: [Left:< 3 seconds] [Right:< 3 seconds] Blood Pressure: Brachial: [Left:120] [Right:120] Dorsalis Pedis: 70 [Left:Dorsalis Pedis: 50] Ankle: Posterior Tibial: 60 [Left:Posterior Tibial: 70 0.58] [Right:0.58] Toe Nail Assessment Left: Right: Thick: No No Discolored: Yes Yes Deformed: No No Improper Length and Hygiene: No No Electronic Signature(s) Signed: 07/27/2017 4:35:14 PM By: Alric Quan Entered By: Alric Quan on 07/26/2017 11:32:43 Justin Hancock ([id number]) -------------------------------------------------------------------------------- Multi Wound Chart Details Patient Name: Justin Hancock. Date of Service: 07/26/2017 10:30 AM Medical Record Number: [id number] Patient Account Number: [id number] Date of Birth/Sex: 19-Mar-1927 (83 y.o. Male) Treating RN: Ahmed Prima Primary Care Tyaira Heward: [place] Other Clinician: Referring Astella Desir: Lujean Amel Treating Pedram Goodchild/Extender: Ricard Dillon Weeks in Treatment: 0 Vital Signs Height(in): 73 Pulse(bpm): 42 Weight(lbs): 225.6 Blood Pressure(mmHg): 132/57 Body Mass Index(BMI): 30 Temperature(F): 98.6 Respiratory Rate 16 (breaths/min): Photos: [1:No Photos] [2:No Photos] [3:No Photos] Wound Location: [1:Right Lower Leg - Medial] [2:Left Lower Leg - Medial, Proximal] [3:Left Lower Leg - Medial, Distal] Wounding Event: [1:Gradually Appeared] [2:Gradually Appeared] [3:Gradually Appeared] 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] [2:Lower Extremity] [3:Diabetic Wound/Ulcer of the Lower Extremity] Comorbid History: [1:Cataracts, Glaucoma, Type II Cataracts, Glaucoma, Type II Diabetes] [2:Diabetes] [3:Cataracts, Glaucoma, Type II Diabetes] Date Acquired: [1:07/05/2017] [2:07/05/2017] [3:07/05/2017] Weeks of Treatment: [1:0] [2:0] [3:0] Wound Status: [1:Open] [2:Open] [3:Open] Measurements L x W x D [1:5.8x3.7x0.1] [2:0.9x1.7x0.1] [3:1.4x1.8x0.1] (cm) Area (cm) : [1:16.855] [2:1.202] [3:1.979] Volume (cm) : [1:1.685] [2:0.12] [3:0.198] Classification: [1:Grade 1] [2:Grade 1] [3:Grade 1] Exudate Amount: [1:Large] [2:Large] [3:Large] Exudate Type: [1:Serous] [2:Serosanguineous] [3:Serosanguineous] Exudate Color: [1:amber] [2:red, brown] [3:red, brown] Wound Margin: [1:Distinct, outline attached] [2:Distinct, outline attached] [3:Distinct, outline attached] Granulation Amount: [1:None Present (0%)] [2:Large (67-100%)] [3:Large (67-100%)] Granulation Quality: [1:N/A] [2:Red] [3:Red] Necrotic Amount: [1:Large (67-100%)] [2:Small (1-33%)] [3:Small (1-33%)] Necrotic Tissue: [1:Eschar, Adherent Slough] [2:Adherent Slough] [3:Adherent Slough] Exposed Structures: [1:Fascia: No Fat Layer (Subcutaneous Tissue) Exposed: No Tendon: No Muscle: No Joint: No Bone: No] [2:Fascia: No Fat Layer (Subcutaneous Tissue) Exposed: No Tendon: No Muscle: No Joint: No Bone: No] [3:Fascia: No Fat Layer (Subcutaneous Tissue) Exposed:  No Tendon: No Muscle:  No Joint: No Bone: No] Epithelialization: [1:None] [2:Small (1-33%)] [3:Small (1-33%)] Debridement: [1:Open Wound/Selective ([id number]) - Selective] [2:N/A] [3:N/A] Pre-procedure [1:11:34] [2:N/A] [3:N/A] Verification/Time Out Taken: Pain Control: [1:Lidocaine 4% Topical Solution N/A] [3:N/A] Tissue Debrided: Fibrin/Slough, Exudates N/A N/A Level: Non-Viable Tissue N/A N/A Debridement Area (sq cm): 21.46 N/A N/A Instrument: Forceps, Scissors  N/A N/A Bleeding: Minimum N/A N/A Hemostasis Achieved: Pressure N/A N/A Procedural Pain: 0 N/A N/A Post Procedural Pain: 0 N/A N/A Debridement Treatment Procedure was tolerated well N/A N/A Response: Post Debridement 5.8x3.8x0.1 N/A N/A Measurements L x W x D (cm) Post Debridement Volume: 1.731 N/A N/A (cm) Periwound Skin Texture: No Abnormalities Noted No Abnormalities Noted No Abnormalities Noted Periwound Skin Moisture: Maceration: Yes Maceration: Yes No Abnormalities Noted Periwound Skin Color: No Abnormalities Noted No Abnormalities Noted No Abnormalities Noted Temperature: No Abnormality No Abnormality No Abnormality Tenderness on Palpation: Yes Yes Yes Wound Preparation: Ulcer Cleansing: Ulcer Cleansing: Ulcer Cleansing: Rinsed/Irrigated with Saline Rinsed/Irrigated with Saline Rinsed/Irrigated with Saline Topical Anesthetic Applied: Topical Anesthetic Applied: Topical Anesthetic Applied: Other: lidocaine 4% Other: lidocaine 4% Other: lidocaine 4% Procedures Performed: Debridement N/A N/A Treatment Notes Electronic Signature(s) Signed: 07/26/2017 4:48:36 PM By: Linton Ham MD Entered By: Linton Ham on 07/26/2017 12:29:06 Justin Hancock ([id number]) -------------------------------------------------------------------------------- [place] Details Patient Name: Justin Hancock, Justin Hancock. Date of Service: 07/26/2017 10:30 AM Medical Record Number: [id number] Patient Account Number: [id number] Date of Birth/Sex: 10-28-1926 (83 y.o. Male) Treating RN: Ahmed Prima Primary Care Anthonyjames Bargar: [place] Pink Other Clinician: Referring Ayahna Solazzo: Lujean Amel Treating Aneshia Jacquet/Extender: Tito Dine in Treatment: 0 Active Inactive ` Abuse / Safety / Falls / Self Care Management Nursing Diagnoses: Potential for falls Goals: Patient will not experience any injury related to falls Date Initiated: 07/26/2017 Target Resolution Date:  11/05/2017 Goal Status: Active Interventions: Assess fall risk on admission and as needed Notes: ` Nutrition Nursing Diagnoses: Imbalanced nutrition Impaired glucose control: actual or potential Potential for alteratiion in Nutrition/Potential for imbalanced nutrition Goals: Patient/caregiver agrees to and verbalizes understanding of need to use nutritional supplements and/or vitamins as prescribed Date Initiated: 07/26/2017 Target Resolution Date: 10/01/2017 Goal Status: Active Patient/caregiver will maintain therapeutic glucose control Date Initiated: 07/26/2017 Target Resolution Date: 10/01/2017 Goal Status: Active Interventions: Assess patient nutrition upon admission and as needed per policy Provide education on elevated blood sugars and impact on wound healing Notes: ` Orientation to the Wound Care Program Nursing Diagnoses: Knowledge deficit related to the wound healing center program Justin Hancock, Justin Hancock ([id number]) Goals: Patient/caregiver will verbalize understanding of the [place] Date Initiated: 07/26/2017 Target Resolution Date: 08/06/2017 Goal Status: Active Interventions: Provide education on orientation to the wound center Notes: ` Wound/Skin Impairment Nursing Diagnoses: Impaired tissue integrity Knowledge deficit related to ulceration/compromised skin integrity Goals: Ulcer/skin breakdown will have a volume reduction of 80% by week 12 Date Initiated: 07/26/2017 Target Resolution Date: 11/05/2017 Goal Status: Active Interventions: Assess patient/caregiver ability to perform ulcer/skin care regimen upon admission and as needed Assess ulceration(s) every visit Notes: Electronic Signature(s) Signed: 07/27/2017 4:35:14 PM By: Alric Quan Entered By: Alric Quan on 07/26/2017 11:33:55 Justin Hancock ([id number]) -------------------------------------------------------------------------------- Pain Assessment Details Patient  Name: Justin Hancock. Date of Service: 07/26/2017 10:30 AM Medical Record Number: [id number] Patient Account Number: [id number] Date of Birth/Sex: 02-Jan-1927 (83 y.o. Male) Treating RN: Ahmed Prima Primary Care Alexei Doswell: [place] Other Clinician: Referring Shakira Los: Lujean Amel Treating Velita Quirk/Extender: Ricard Dillon Weeks in Treatment: 0 Active Problems Location of Pain Severity and Description of Pain  Patient Has Paino No Site Locations Pain Management and Medication Current Pain Management: Electronic Signature(s) Signed: 07/27/2017 4:35:14 PM By: Alric Quan Entered By: Alric Quan on 07/26/2017 10:50:18 Justin Hancock ([id number]) -------------------------------------------------------------------------------- Wound Assessment Details Patient Name: Justin Hancock. Date of Service: 07/26/2017 10:30 AM Medical Record Number: [id number] Patient Account Number: [id number] Date of Birth/Sex: 11/23/1926 (83 y.o. Male) Treating RN: Ahmed Prima Primary Care Tywan Siever: [place] Other Clinician: Referring Kenzi Bardwell: Lujean Amel Treating Shatha Hooser/Extender: Ricard Dillon Weeks in Treatment: 0 Wound Status Wound Number: 1 Primary Etiology: Diabetic Wound/Ulcer of the Lower Extremity Wound Location: Right Lower Leg - Medial Wound Status: Open Wounding Event: Gradually Appeared Comorbid Cataracts, Glaucoma, Type II Diabetes Date Acquired: 07/05/2017 History: Weeks Of Treatment: 0 Clustered Wound: No Photos Photo Uploaded By: Alric Quan on 07/27/2017 16:12:54 Wound Measurements Length: (cm) 5.8 Width: (cm) 3.7 Depth: (cm) 0.1 Area: (cm) 16.855 Volume: (cm) 1.685 % Reduction in Area: % Reduction in Volume: Epithelialization: None Tunneling: No Undermining: No Wound Description Classification: Grade 1 Wound Margin: Distinct, outline attached Exudate Amount: Large Exudate Type: Serous Exudate Color: amber Foul  Odor After Cleansing: No Slough/Fibrino Yes Wound Bed Granulation Amount: None Present (0%) Exposed Structure Necrotic Amount: Large (67-100%) Fascia Exposed: No Necrotic Quality: Eschar, Adherent Slough Fat Layer (Subcutaneous Tissue) Exposed: No Tendon Exposed: No Muscle Exposed: No Joint Exposed: No Bone Exposed: No Periwound Skin Texture Justin Hancock, Justin E. ([id number]) Texture Color No Abnormalities Noted: No No Abnormalities Noted: No Moisture Temperature / Pain No Abnormalities Noted: No Temperature: No Abnormality Maceration: Yes Tenderness on Palpation: Yes Wound Preparation Ulcer Cleansing: Rinsed/Irrigated with Saline Topical Anesthetic Applied: Other: lidocaine 4%, Treatment Notes Wound #1 (Right, Medial Lower Leg) 1. Cleansed with: Clean wound with Normal Saline 2. Anesthetic Topical Lidocaine 4% cream to wound bed prior to debridement 4. Dressing Applied: Other dressing (specify in notes) 5. Secondary Dressing Applied ABD Pad 7. Secured with Tape Notes silvercel, drawtex, kerlix, coban, unna to anchor Electronic Signature(s) Signed: 07/26/2017 11:27:19 AM By: Alric Quan Entered By: Alric Quan on 07/26/2017 11:27:18 Justin Hancock ([id number]) -------------------------------------------------------------------------------- Wound Assessment Details Patient Name: Justin Hancock. Date of Service: 07/26/2017 10:30 AM Medical Record Number: [id number] Patient Account Number: [id number] Date of Birth/Sex: Aug 28, 1927 (83 y.o. Male) Treating RN: Ahmed Prima Primary Care Laticia Vannostrand: [place] Other Clinician: Referring Hazley Dezeeuw: Lujean Amel Treating Milagro Belmares/Extender: Ricard Dillon Weeks in Treatment: 0 Wound Status Wound Number: 2 Primary Etiology: Diabetic Wound/Ulcer of the Lower Extremity Wound Location: Left Lower Leg - Medial, Proximal Wound Status: Open Wounding Event: Gradually Appeared Comorbid Cataracts, Glaucoma,  Type II Diabetes Date Acquired: 07/05/2017 History: Weeks Of Treatment: 0 Clustered Wound: No Photos Photo Uploaded By: Alric Quan on 07/27/2017 16:14:27 Wound Measurements Length: (cm) 0.9 Width: (cm) 1.7 Depth: (cm) 0.1 Area: (cm) 1.202 Volume: (cm) 0.12 % Reduction in Area: % Reduction in Volume: Epithelialization: Small (1-33%) Tunneling: No Undermining: No Wound Description Classification: Grade 1 Wound Margin: Distinct, outline attached Exudate Amount: Large Exudate Type: Serosanguineous Exudate Color: red, brown Foul Odor After Cleansing: No Slough/Fibrino Yes Wound Bed Granulation Amount: Large (67-100%) Exposed Structure Granulation Quality: Red Fascia Exposed: No Necrotic Amount: Small (1-33%) Fat Layer (Subcutaneous Tissue) Exposed: No Necrotic Quality: Adherent Slough Tendon Exposed: No Muscle Exposed: No Joint Exposed: No Bone Exposed: No Periwound Skin Texture Justin Hancock, Justin E. ([id number]) Texture Color No Abnormalities Noted: No No Abnormalities Noted: No Moisture Temperature / Pain No Abnormalities Noted: No Temperature: No Abnormality Maceration: Yes Tenderness on Palpation: Yes  Wound Preparation Ulcer Cleansing: Rinsed/Irrigated with Saline Topical Anesthetic Applied: Other: lidocaine 4%, Treatment Notes Wound #2 (Left, Proximal, Medial Lower Leg) 1. Cleansed with: Clean wound with Normal Saline 2. Anesthetic Topical Lidocaine 4% cream to wound bed prior to debridement 4. Dressing Applied: Other dressing (specify in notes) 5. Secondary Dressing Applied ABD Pad 7. Secured with Tape Notes silvercel, drawtex, kerlix, coban, unna to anchor Electronic Signature(s) Signed: 07/26/2017 11:28:37 AM By: Alric Quan Entered By: Alric Quan on 07/26/2017 11:28:37 Justin Hancock ([id number]) -------------------------------------------------------------------------------- Wound Assessment Details Patient Name: Justin Hancock. Date of Service: 07/26/2017 10:30 AM Medical Record Number: [id number] Patient Account Number: [id number] Date of Birth/Sex: [birth year] (83 y.o. Male) Treating RN: Ahmed Prima Primary Care Brooklee Michelin: [place] Other Clinician: Referring Kamryn Messineo: Lujean Amel Treating Kaysan Peixoto/Extender: Ricard Dillon Weeks in Treatment: 0 Wound Status Wound Number: 3 Primary Etiology: Diabetic Wound/Ulcer of the Lower Extremity Wound Location: Left Lower Leg - Medial, Distal Wound Status: Open Wounding Event: Gradually Appeared Comorbid Cataracts, Glaucoma, Type II Diabetes Date Acquired: 07/05/2017 History: Weeks Of Treatment: 0 Clustered Wound: No Photos Photo Uploaded By: Alric Quan on 07/27/2017 16:14:28 Wound Measurements Length: (cm) 1.4 Width: (cm) 1.8 Depth: (cm) 0.1 Area: (cm) 1.979 Volume: (cm) 0.198 % Reduction in Area: % Reduction in Volume: Epithelialization: Small (1-33%) Tunneling: No Undermining: No Wound Description Classification: Grade 1 Wound Margin: Distinct, outline attached Exudate Amount: Large Exudate Type: Serosanguineous Exudate Color: red, brown Foul Odor After Cleansing: No Slough/Fibrino Yes Wound Bed Granulation Amount: Large (67-100%) Exposed Structure Granulation Quality: Red Fascia Exposed: No Necrotic Amount: Small (1-33%) Fat Layer (Subcutaneous Tissue) Exposed: No Necrotic Quality: Adherent Slough Tendon Exposed: No Muscle Exposed: No Joint Exposed: No Bone Exposed: No Periwound Skin Texture Justin Hancock, Justin E. ([id number]) Texture Color No Abnormalities Noted: No No Abnormalities Noted: No Moisture Temperature / Pain No Abnormalities Noted: No Temperature: No Abnormality Tenderness on Palpation: Yes Wound Preparation Ulcer Cleansing: Rinsed/Irrigated with Saline Topical Anesthetic Applied: Other: lidocaine 4%, Treatment Notes Wound #3 (Left, Distal, Medial Lower Leg) 1. Cleansed with: Clean wound  with Normal Saline 2. Anesthetic Topical Lidocaine 4% cream to wound bed prior to debridement 4. Dressing Applied: Other dressing (specify in notes) 5. Secondary Dressing Applied ABD Pad 7. Secured with Tape Notes silvercel, drawtex, kerlix, coban, unna to anchor Electronic Signature(s) Signed: 07/26/2017 11:29:33 AM By: Alric Quan Entered By: Alric Quan on 07/26/2017 11:29:32 Justin Hancock ([id number]) -------------------------------------------------------------------------------- Vitals Details Patient Name: Justin Hancock. Date of Service: 07/26/2017 10:30 AM Medical Record Number: [id number] Patient Account Number: [id number] Date of Birth/Sex: 12-28-1926 (83 y.o. Male) Treating RN: Ahmed Prima Primary Care Hemi Chacko: [place] Pink Other Clinician: Referring Worthington Cruzan: Lujean Amel Treating Paxton Binns/Extender: Tito Dine in Treatment: 0 Vital Signs Time Taken: 10:50 Temperature (F): 98.6 Height (in): 73 Pulse (bpm): 62 Source: Stated Respiratory Rate (breaths/min): 16 Weight (lbs): 225.6 Blood Pressure (mmHg): 132/57 Source: Measured Reference Range: 80 - 120 mg / dl Body Mass Index (BMI): 29.8 Electronic Signature(s) Signed: 07/27/2017 4:35:14 PM By: Alric Quan Entered By: Alric Quan on 07/26/2017 10:53:08

## 2017-07-29 NOTE — Progress Notes (Signed)
Justin Hancock ([id number]) Visit Report for 07/26/2017 Abuse/Suicide Risk Screen Details Patient Name: Justin Hancock, Justin Hancock. Date of Service: 07/26/2017 10:30 AM Medical Record Number: [id number] Patient Account Number: [id number] Date of Birth/Sex: May 26, 1927 (83 y.o. Male) Treating RN: Ahmed Prima Primary Care Joselinne Lawal: [place] Pink Other Clinician: Referring Amberrose Friebel: Lujean Amel Treating Jasman Murri/Extender: Ricard Dillon Weeks in Treatment: 0 Abuse/Suicide Risk Screen Items Answer ABUSE/SUICIDE RISK SCREEN: Has anyone close to you tried to hurt or harm you recentlyo No Do you feel uncomfortable with anyone in your familyo No Has anyone forced you do things that you didnot want to doo No Do you have any thoughts of harming yourselfo No Patient displays signs or symptoms of abuse and/or neglect. No Electronic Signature(s) Signed: 07/27/2017 4:35:14 PM By: Alric Quan Entered By: Alric Quan on 07/26/2017 11:00:28 Justin Hancock ([id number]) -------------------------------------------------------------------------------- Activities of Daily Living Details Patient Name: Justin Hancock. Date of Service: 07/26/2017 10:30 AM Medical Record Number: [id number] Patient Account Number: [id number] Date of Birth/Sex: [birth year] (83 y.o. Male) Treating RN: Ahmed Prima Primary Care Evalin Shawhan: [place] Pink Other Clinician: Referring Baylor Cortez: Lujean Amel Treating [place] Yarbro/Extender: Ricard Dillon Weeks in Treatment: 0 Activities of Daily Living Items Answer Activities of Daily Living (Please select one for each item) Drive Automobile Completely Able Take Medications Completely Able Use Telephone Completely Able Care for Appearance Completely Able Use Toilet Completely Able Bath / Shower Completely Able Dress Self Completely Able Feed Self Completely Able Walk Completely Able Get In / Out Bed Completely Able Housework Completely Able Prepare Meals  Completely [place] for Self Completely Able Electronic Signature(s) Signed: 07/27/2017 4:35:14 PM By: Alric Quan Entered By: Alric Quan on 07/26/2017 11:00:49 Justin Hancock ([id number]) -------------------------------------------------------------------------------- Education Assessment Details Patient Name: Justin Hancock Date of Service: 07/26/2017 10:30 AM Medical Record Number: [id number] Patient Account Number: [id number] Date of Birth/Sex: 01-18-1927 (83 y.o. Male) Treating RN: Ahmed Prima Primary Care Aleria Maheu: [place] Pink Other Clinician: Referring Saadiq Poche: Lujean Amel Treating Keylor Rands/Extender: Tito Dine in Treatment: 0 Primary Learner Assessed: Patient Learning Preferences/Education Level/Primary Language Learning Preference: Explanation, Printed Material Highest Education Level: College or Above Preferred Language: English Cognitive Barrier Assessment/Beliefs Language Barrier: No Translator Needed: No Memory Deficit: No Emotional Barrier: No Cultural/Religious Beliefs Affecting Medical Care: No Physical Barrier Assessment Impaired Vision: No Impaired Hearing: No Decreased Hand dexterity: No Knowledge/Comprehension Assessment Knowledge Level: Medium Comprehension Level: Medium Ability to understand written Medium instructions: Ability to understand verbal Medium instructions: Motivation Assessment Anxiety Level: Calm Cooperation: Cooperative Education Importance: Acknowledges Need Interest in Health Problems: Asks Questions Perception: Coherent Willingness to Engage in Self- Medium Management Activities: Readiness to Engage in Self- Medium Management Activities: Electronic Signature(s) Signed: 07/27/2017 4:35:14 PM By: Alric Quan Entered By: Alric Quan on 07/26/2017 11:01:09 Justin Hancock  ([id number]) -------------------------------------------------------------------------------- Fall Risk Assessment Details Patient Name: Justin Hancock. Date of Service: 07/26/2017 10:30 AM Medical Record Number: [id number] Patient Account Number: [id number] Date of Birth/Sex: Oct 26, 1926 (83 y.o. Male) Treating RN: Ahmed Prima Primary Care Athanasia [place]: [place] Pink Other Clinician: Referring Cadence Haslam: Lujean Amel Treating Marlyn Tondreau/Extender: Tito Dine in Treatment: 0 Fall Risk Assessment Items Have you had 2 or more falls in the last 12 monthso 0 No Have you had any fall that resulted in injury in the last 12 monthso 0 Yes FALL RISK ASSESSMENT: History of falling - immediate or within 3 months 25 Yes Secondary diagnosis 15 Yes Ambulatory aid None/bed rest/wheelchair/nurse 0 No Crutches/cane/walker 15 Yes  Furniture 0 No IV Access/Saline Lock 0 No Gait/Training Normal/bed rest/immobile 0 No Weak 0 No Impaired 20 Yes Mental Status Oriented to own ability 0 Yes Electronic Signature(s) Signed: 07/27/2017 4:35:14 PM By: Alric Quan Entered By: Alric Quan on 07/26/2017 11:01:32 Justin Hancock ([id number]) -------------------------------------------------------------------------------- Foot Assessment Details Patient Name: Justin Hancock. Date of Service: 07/26/2017 10:30 AM Medical Record Number: [id number] Patient Account Number: [id number] Date of Birth/Sex: 04/12/1927 (83 y.o. Male) Treating RN: Ahmed Prima Primary Care Ramesha Poster: [place] Pink Other Clinician: Referring Ashutosh Dieguez: Lujean Amel Treating Erez Mccallum/Extender: Ricard Dillon Weeks in Treatment: 0 Foot Assessment Items Site Locations + = Sensation present, - = Sensation absent, C = Callus, U = Ulcer R = Redness, W = Warmth, M = Maceration, PU = Pre-ulcerative lesion F = Fissure, S = Swelling, D = Dryness Assessment Right: Left: Other Deformity: No No Prior Foot Ulcer:  No No Prior Amputation: No No Charcot Joint: No No Ambulatory Status: Ambulatory With Help Assistance Device: Walker Gait: Steady Electronic Signature(s) Signed: 07/27/2017 4:35:14 PM By: Alric Quan Entered By: Alric Quan on 07/26/2017 [place], [place]. ([id number]) -------------------------------------------------------------------------------- Nutrition Risk Assessment Details Patient Name: Justin Hancock. Date of Service: 07/26/2017 10:30 AM Medical Record Number: [id number] Patient Account Number: [id number] Date of Birth/Sex: December 25, 1926 (83 y.o. Male) Treating RN: Ahmed Prima Primary Care Venie Montesinos: [place] Pink Other Clinician: Referring Jojuan Champney: Lujean Amel Treating Yaffa Seckman/Extender: Ricard Dillon Weeks in Treatment: 0 Height (in): 73 Weight (lbs): 225.6 Body Mass Index (BMI): 29.8 Nutrition Risk Assessment Items NUTRITION RISK SCREEN: I have an illness or condition that made me change the kind and/or amount of 0 No food I eat I eat fewer than two meals per day 0 No I eat few fruits and vegetables, or milk products 0 No I have three or more drinks of beer, liquor or wine almost every day 0 No I have tooth or mouth problems that make it hard for me to eat 0 No I don't always have enough money to buy the food I need 0 No I eat alone most of the time 0 No I take three or more different prescribed or over-the-counter drugs a day 1 Yes Without wanting to, I have lost or gained 10 pounds in the last six months 0 No I am not always physically able to shop, cook and/or feed myself 0 No Nutrition Protocols Good Risk Protocol 0 No interventions needed Moderate Risk Protocol Electronic Signature(s) Signed: 07/27/2017 4:35:14 PM By: Alric Quan Entered By: Alric Quan on 07/26/2017 11:01:40

## 2017-07-29 NOTE — Progress Notes (Signed)
DREXLER, MALAND ([id number]) Visit Report for 07/26/2017 Debridement Details Patient Name: Justin Hancock, Justin Hancock. Date of Service: 07/26/2017 10:30 AM Medical Record Number: [id number] Patient Account Number: [id number] Date of Birth/Sex: 1927-05-12 (83 y.o. Male) Treating RN: Ahmed Prima Primary Care Provider: [place] Pink Other Clinician: Referring Provider: Lujean Amel Treating Provider/Extender: Tito Dine in Treatment: 0 Debridement Performed for Wound #1 Right,Medial Lower Leg Assessment: Performed By: Physician Ricard Dillon, MD Debridement: Open Wound/Selective Severity of Tissue Pre Fat layer exposed Debridement: Debridement Description: Selective Pre-procedure Verification/Time Yes - 11:34 Out Taken: Start Time: 11:35 Pain Control: Lidocaine 4% Topical Solution Level: Non-Viable Tissue Total Area Debrided (L x W): 5.8 (cm) x 3.7 (cm) = 21.46 (cm) Tissue and other material Viable, Non-Viable, Exudate, Fibrin/Slough debrided: Instrument: Forceps, Scissors Bleeding: Minimum Hemostasis Achieved: Pressure End Time: 11:37 Procedural Pain: 0 Post Procedural Pain: 0 Response to Treatment: Procedure was tolerated well Post Debridement Measurements of Total Wound Length: (cm) 5.8 Width: (cm) 3.8 Depth: (cm) 0.1 Volume: (cm) 1.731 Character of Wound/Ulcer Post Debridement: Requires Further Debridement Severity of Tissue Post Debridement: Fat layer exposed Post Procedure Diagnosis Same as Pre-procedure Electronic Signature(s) Signed: 07/26/2017 4:48:36 PM By: Linton Ham MD Signed: 07/27/2017 4:35:14 PM By: Alric Quan Entered By: Alric Quan on 07/26/2017 11:36:23 Justin Hancock ([id number]) -------------------------------------------------------------------------------- HPI Details Patient Name: Justin Hancock. Date of Service: 07/26/2017 10:30 AM Medical Record Number: [id number] Patient Account Number:  [id number] Date of Birth/Sex: Dec 18, 1926 (83 y.o. Male) Treating RN: Ahmed Prima Primary Care Provider: [place] Pink Other Clinician: Referring Provider: Lujean Amel Treating Provider/Extender: Ricard Dillon Weeks in Treatment: 0 History of Present Illness HPI Description: 07/26/17; Mr. Seiden is a gentleman who follows with [facility]. He has known atherosclerotic disease with among other things a left renal artery stent. He takes Plavix. He is a type II diabetic on metformin. He also has multiple coronary artery stents and known peripheral vascular disease. The patient states that he is followed by Dr. dew with apparent serial arterial studies in his legs he thinks the last one was done a year ago. He was a remote smoker quitting many years ago. The patient tells me that roughly 2-3 weeks ago he developed raised blisters o2 in his left leg and o2 in his right leg.Marland Kitchen He states that these happened at night and there is no known trauma. The edema in his legs was not clearly much larger than it is now. He does not describe claudication. He is been using Neosporin and Band-Aids to the wounds. ABIs in our clinics were 0.58 bilaterally Electronic Signature(s) Signed: 07/26/2017 4:48:36 PM By: Linton Ham MD Entered By: Linton Ham on 07/26/2017 12:31:51 Justin Hancock ([id number]) -------------------------------------------------------------------------------- Physical Exam Details Patient Name: PEPPER, WYNDHAM. Date of Service: 07/26/2017 10:30 AM Medical Record Number: [id number] Patient Account Number: [id number] Date of Birth/Sex: 07-22-1927 (83 y.o. Male) Treating RN: Ahmed Prima Primary Care Provider: [place] Pink Other Clinician: Referring Provider: Lujean Amel Treating Provider/Extender: Ricard Dillon Weeks in Treatment: 0 Constitutional Sitting or standing Blood Pressure is within target range for patient.. Pulse regular and  within target range for patient.Marland Kitchen Respirations regular, non-labored and within target range.. Temperature is normal and within the target range for the patient.Marland Kitchen appears in no distress. Eyes Conjunctivae clear. No discharge. Respiratory Respiratory effort is easy and symmetric bilaterally. Rate is normal at rest and on room air.. Bilateral breath sounds are clear and equal in all lobes with no wheezes,  rales or rhonchi.. Cardiovascular Heart sounds are soft high-pitched midsystolic murmur. Palpable bilaterally. Nonpalpable bilaterally. The patient has what appears to be chronic hemosiderin deposition with anterior leg venous inflammation/stasis dermatitis. Gastrointestinal (GI) Abdomen is soft and non-distended without masses or tenderness. Bowel sounds active in all quadrants.. Lymphatic None palpable in the popliteal or inguinal area. Integumentary (Hair, Skin) no rashes are seen. Venous insufficiency and venous inflammation in the lower legs. Psychiatric No evidence of depression, anxiety, or agitation. Calm, cooperative, and communicative. Appropriate interactions and affect.. Notes Wound exam; the patient has 2 wounds on the medial lower aspect of the left leg both of these are well granulated and appear to be healthy with rims of epithelialization. Surrounding hemosiderin noted there is no evidence of surrounding cellulitis oThe patient on the right leg had a more sizable and apparently more recent wound. Denuded skin from a previous blister removed with pickups and scissors. Base of the wound looks healthy. Just inferior to this there is a smaller wound that appears to be largely closed over. This will need to be followed as well Electronic Signature(s) Signed: 07/26/2017 4:48:36 PM By: Linton Ham MD Entered By: Linton Ham on 07/26/2017 12:34:22 Justin Hancock ([id number]) -------------------------------------------------------------------------------- Physician  Orders Details Patient Name: Justin Hancock. Date of Service: 07/26/2017 10:30 AM Medical Record Number: [id number] Patient Account Number: [id number] Date of Birth/Sex: 04-04-1927 (83 y.o. Male) Treating RN: Ahmed Prima Primary Care Provider: [place] Pink Other Clinician: Referring Provider: Lujean Amel Treating Provider/Extender: Tito Dine in Treatment: 0 Verbal / Phone Orders: Yes Clinician: Pinkerton, Debi Read Back and Verified: Yes Diagnosis Coding Wound Cleansing Wound #1 Right,Medial Lower Leg o Clean wound with Normal Saline. o Cleanse wound with mild soap and water Wound #2 Left,Proximal,Medial Lower Leg o Clean wound with Normal Saline. o Cleanse wound with mild soap and water Wound #3 Left,Distal,Medial Lower Leg o Clean wound with Normal Saline. o Cleanse wound with mild soap and water Anesthetic Wound #1 Right,Medial Lower Leg o Topical Lidocaine 4% cream applied to wound bed prior to debridement Wound #2 Left,Proximal,Medial Lower Leg o Topical Lidocaine 4% cream applied to wound bed prior to debridement Wound #3 Left,Distal,Medial Lower Leg o Topical Lidocaine 4% cream applied to wound bed prior to debridement Primary Wound Dressing Wound #1 Right,Medial Lower Leg o Silvercel Non-Adherent Wound #2 Left,Proximal,Medial Lower Leg o Silvercel Non-Adherent Wound #3 Left,Distal,Medial Lower Leg o Silvercel Non-Adherent Secondary Dressing Wound #1 Right,Medial Lower Leg o ABD pad o XtraSorb Wound #2 Left,Proximal,Medial Lower Leg o ABD pad o XtraSorb Wound #3 Left,Distal,Medial Lower Leg o ABD pad Kreiger, Adarryl E. ([id number]) o XtraSorb Dressing Change Frequency Wound #1 Right,Medial Lower Leg o Change dressing every week Wound #2 Left,Proximal,Medial Lower Leg o Change dressing every week Wound #3 Left,Distal,Medial Lower Leg o Change dressing every week Follow-up Appointments Wound  #1 Right,Medial Lower Leg o Return Appointment in 1 week. Wound #2 Left,Proximal,Medial Lower Leg o Return Appointment in 1 week. Wound #3 Left,Distal,Medial Lower Leg o Return Appointment in 1 week. Edema Control Wound #1 Right,Medial Lower Leg o Kerlix and Coban - Bilateral - unna to anchor o Elevate legs to the level of the heart and pump ankles as often as possible Wound #2 Left,Proximal,Medial Lower Leg o Kerlix and Coban - Bilateral - unna to anchor o Elevate legs to the level of the heart and pump ankles as often as possible Wound #3 Left,Distal,Medial Lower Leg o Kerlix and Coban - Bilateral - unna to anchor [employer]  o Elevate legs to the level of the heart and pump ankles as often as possible Additional Orders / Instructions Wound #1 Right,Medial Lower Leg o Increase protein intake. Wound #2 Left,Proximal,Medial Lower Leg o Increase protein intake. Wound #3 Left,Distal,Medial Lower Leg o Increase protein intake. Electronic Signature(s) Signed: 07/26/2017 4:48:36 PM By: Linton Ham MD Signed: 07/27/2017 4:35:14 PM By: Alric Quan Entered By: Alric Quan on 07/26/2017 15:46:43 Justin Hancock ([id number]) -------------------------------------------------------------------------------- Problem List Details Patient Name: MILLER, LIMEHOUSE. Date of Service: 07/26/2017 10:30 AM Medical Record Number: [id number] Patient Account Number: [id number] Date of Birth/Sex: 01-10-1927 (83 y.o. Male) Treating RN: Ahmed Prima Primary Care Provider: [place] Pink Other Clinician: Referring Provider: Lujean Amel Treating Provider/Extender: Tito Dine in Treatment: 0 Active Problems ICD-10 Encounter Code Description Active Date Diagnosis L97.221 Non-pressure chronic ulcer of left calf limited to breakdown of skin 07/26/2017 Yes L97.211 Non-pressure chronic ulcer of right calf limited to breakdown of skin 07/26/2017 Yes I87.333 Chronic  venous hypertension (idiopathic) with ulcer and 07/26/2017 Yes inflammation of bilateral lower extremity E11.51 Type 2 diabetes mellitus with diabetic peripheral angiopathy without 07/26/2017 Yes gangrene Inactive Problems Resolved Problems Electronic Signature(s) Signed: 07/26/2017 4:48:36 PM By: Linton Ham MD Entered By: Linton Ham on 07/26/2017 12:28:51 Justin Hancock ([id number]) -------------------------------------------------------------------------------- Progress Note Details Patient Name: Justin Hancock. Date of Service: 07/26/2017 10:30 AM Medical Record Number: [id number] Patient Account Number: [id number] Date of Birth/Sex: 16-Mar-1927 (83 y.o. Male) Treating RN: Ahmed Prima Primary Care Provider: [place] Pink Other Clinician: Referring Provider: Lujean Amel Treating Provider/Extender: Ricard Dillon Weeks in Treatment: 0 Subjective History of Present Illness (HPI) 07/26/17; Mr. Brayfield is a gentleman who follows with [facility]. He has known atherosclerotic disease with among other things a left renal artery stent. He takes Plavix. He is a type II diabetic on metformin. He also has multiple coronary artery stents and known peripheral vascular disease. The patient states that he is followed by Dr. dew with apparent serial arterial studies in his legs he thinks the last one was done a year ago. He was a remote smoker quitting many years ago. The patient tells me that roughly 2-3 weeks ago he developed raised blisters o2 in his left leg and o2 in his right leg.Marland Kitchen He states that these happened at night and there is no known trauma. The edema in his legs was not clearly much larger than it is now. He does not describe claudication. He is been using Neosporin and Band-Aids to the wounds. ABIs in our clinics were 0.58 bilaterally Wound History Patient presents with 4 open wounds that have been present for approximately 3 weeks.  Patient has been treating wounds in the following manner: neopsorin, bandage. Laboratory tests have not been performed in the last month. Patient reportedly has not tested positive for an antibiotic resistant organism. Patient reportedly has not tested positive for osteomyelitis. Patient reportedly has had testing performed to evaluate circulation in the legs. Patient experiences the following problems associated with their wounds: swelling. Patient History Information obtained from Patient. Allergies penicillin Family History Diabetes - Mother, Heart Disease - Father,Mother,Paternal Grandparents, Hypertension - Mother, No family history of Cancer, Hereditary Spherocytosis, Kidney Disease, Lung Disease, Seizures, Stroke, Thyroid Problems, Tuberculosis. Social History Former smoker - quit 1974, Marital Status - Married, Alcohol Use - Never, Drug Use - No History, Caffeine Use - Rarely. Medical History Eyes Patient has history of Cataracts - surgery, Glaucoma - surgery Endocrine Patient has history of  Type II Diabetes Oncologic Denies history of Received Chemotherapy, Received Radiation Patient is treated with Oral Agents. Blood sugar is tested. Review of Systems (ROS) LAURIER, JASPERSON. ([id number]) Constitutional Symptoms (General Health) The patient has no complaints or symptoms. Ear/Nose/Mouth/Throat The patient has no complaints or symptoms. Oncologic colon Objective Constitutional Sitting or standing Blood Pressure is within target range for patient.. Pulse regular and within target range for patient.Marland Kitchen Respirations regular, non-labored and within target range.. Temperature is normal and within the target range for the patient.Marland Kitchen appears in no distress. Vitals Time Taken: 10:50 AM, Height: 73 in, Source: Stated, Weight: 225.6 lbs, Source: Measured, BMI: 29.8, Temperature: 98.6 F, Pulse: 62 bpm, Respiratory Rate: 16 breaths/min, Blood Pressure: 132/57 mmHg. Eyes Conjunctivae  clear. No discharge. Respiratory Respiratory effort is easy and symmetric bilaterally. Rate is normal at rest and on room air.. Bilateral breath sounds are clear and equal in all lobes with no wheezes, rales or rhonchi.. Cardiovascular Heart sounds are soft high-pitched midsystolic murmur. Palpable bilaterally. Nonpalpable bilaterally. The patient has what appears to be chronic hemosiderin deposition with anterior leg venous inflammation/stasis dermatitis. Gastrointestinal (GI) Abdomen is soft and non-distended without masses or tenderness. Bowel sounds active in all quadrants.. Lymphatic None palpable in the popliteal or inguinal area. Psychiatric No evidence of depression, anxiety, or agitation. Calm, cooperative, and communicative. Appropriate interactions and affect.. General Notes: Wound exam; the patient has 2 wounds on the medial lower aspect of the left leg both of these are well granulated and appear to be healthy with rims of epithelialization. Surrounding hemosiderin noted there is no evidence of surrounding cellulitis The patient on the right leg had a more sizable and apparently more recent wound. Denuded skin from a previous blister removed with pickups and scissors. Base of the wound looks healthy. Just inferior to this there is a smaller wound that appears to be largely closed over. This will need to be followed as well Integumentary (Hair, Skin) no rashes are seen. Venous insufficiency and venous inflammation in the lower legs. Wound #1 status is Open. Original cause of wound was Gradually Appeared. The wound is located on the Right,Medial Lower Leg. The wound measures 5.8cm length x 3.7cm width x 0.1cm depth; 16.855cm^2 area and 1.685cm^3 volume. There is no tunneling or undermining noted. There is a large amount of serous drainage noted. The wound margin is distinct with the ALPHONSA, BRICKLE. ([id number]) outline attached to the wound base. There is no granulation within the  wound bed. There is a large (67-100%) amount of necrotic tissue within the wound bed including Eschar and Adherent Slough. The periwound skin appearance exhibited: Maceration. Periwound temperature was noted as No Abnormality. The periwound has tenderness on palpation. Wound #2 status is Open. Original cause of wound was Gradually Appeared. The wound is located on the Left,Proximal,Medial Lower Leg. The wound measures 0.9cm length x 1.7cm width x 0.1cm depth; 1.202cm^2 area and 0.12cm^3 volume. There is no tunneling or undermining noted. There is a large amount of serosanguineous drainage noted. The wound margin is distinct with the outline attached to the wound base. There is large (67-100%) red granulation within the wound bed. There is a small (1-33%) amount of necrotic tissue within the wound bed including Adherent Slough. The periwound skin appearance exhibited: Maceration. Periwound temperature was noted as No Abnormality. The periwound has tenderness on palpation. Wound #3 status is Open. Original cause of wound was Gradually Appeared. The wound is located on the Left,Distal,Medial Lower Leg. The wound measures 1.4cm  length x 1.8cm width x 0.1cm depth; 1.979cm^2 area and 0.198cm^3 volume. There is no tunneling or undermining noted. There is a large amount of serosanguineous drainage noted. The wound margin is distinct with the outline attached to the wound base. There is large (67-100%) red granulation within the wound bed. There is a small (1-33%) amount of necrotic tissue within the wound bed including Adherent Slough. Periwound temperature was noted as No Abnormality. The periwound has tenderness on palpation. Assessment Active Problems ICD-10 L97.221 - Non-pressure chronic ulcer of left calf limited to breakdown of skin L97.211 - Non-pressure chronic ulcer of right calf limited to breakdown of skin I87.333 - Chronic venous hypertension (idiopathic) with ulcer and inflammation of  bilateral lower extremity E11.51 - Type 2 diabetes mellitus with diabetic peripheral angiopathy without gangrene Procedures Wound #1 Pre-procedure diagnosis of Wound #1 is a Diabetic Wound/Ulcer of the Lower Extremity located on the Right,Medial Lower Leg .Severity of Tissue Pre Debridement is: Fat layer exposed. There was a Non-Viable Tissue Open Wound/Selective [phone]) debridement with total area of 21.46 sq cm performed by Ricard Dillon, MD. with the following instrument(s): Forceps and Scissors to remove Viable and Non-Viable tissue/material including Exudate and Fibrin/Slough after achieving pain control using Lidocaine 4% Topical Solution. A time out was conducted at 11:34, prior to the start of the procedure. A Minimum amount of bleeding was controlled with Pressure. The procedure was tolerated well with a pain level of 0 throughout and a pain level of 0 following the procedure. Post Debridement Measurements: 5.8cm length x 3.8cm width x 0.1cm depth; 1.731cm^3 volume. Character of Wound/Ulcer Post Debridement requires further debridement. Severity of Tissue Post Debridement is: Fat layer exposed. Post procedure Diagnosis Wound #1: Same as Pre-Procedure Plan [place] ([id number]) Wound Cleansing: Wound #1 Right,Medial Lower Leg: Clean wound with Normal Saline. Cleanse wound with mild soap and water Wound #2 Left,Proximal,Medial Lower Leg: Clean wound with Normal Saline. Cleanse wound with mild soap and water Wound #3 Left,Distal,Medial Lower Leg: Clean wound with Normal Saline. Cleanse wound with mild soap and water Anesthetic: Wound #1 Right,Medial Lower Leg: Topical Lidocaine 4% cream applied to wound bed prior to debridement Wound #2 Left,Proximal,Medial Lower Leg: Topical Lidocaine 4% cream applied to wound bed prior to debridement Wound #3 Left,Distal,Medial Lower Leg: Topical Lidocaine 4% cream applied to wound bed prior to debridement Primary Wound  Dressing: Wound #1 Right,Medial Lower Leg: Silvercel Non-Adherent Wound #2 Left,Proximal,Medial Lower Leg: Silvercel Non-Adherent Wound #3 Left,Distal,Medial Lower Leg: Silvercel Non-Adherent Secondary Dressing: Wound #1 Right,Medial Lower Leg: ABD pad Wound #2 Left,Proximal,Medial Lower Leg: ABD pad Wound #3 Left,Distal,Medial Lower Leg: ABD pad Dressing Change Frequency: Wound #1 Right,Medial Lower Leg: Change dressing every week Wound #2 Left,Proximal,Medial Lower Leg: Change dressing every week Wound #3 Left,Distal,Medial Lower Leg: Change dressing every week Follow-up Appointments: Wound #1 Right,Medial Lower Leg: Return Appointment in 1 week. Wound #2 Left,Proximal,Medial Lower Leg: Return Appointment in 1 week. Wound #3 Left,Distal,Medial Lower Leg: Return Appointment in 1 week. Edema Control: Wound #1 Right,Medial Lower Leg: Kerlix and Coban - Bilateral - unna to anchor Elevate legs to the level of the heart and pump ankles as often as possible Wound #2 Left,Proximal,Medial Lower Leg: Kerlix and Coban - Bilateral - unna to anchor Elevate legs to the level of the heart and pump ankles as often as possible Wound #3 Left,Distal,Medial Lower Leg: Kerlix and Coban - Bilateral - unna to anchor Elevate legs to the level of the heart and pump ankles  as often as possible Additional Orders / Instructions: Wound #1 Right,Medial Lower Leg: Increase protein intake. Wound #2 Left,Proximal,Medial Lower Leg: Baillargeon, Kobey E. ([id number]) Increase protein intake. Wound #3 Left,Distal,Medial Lower Leg: Increase protein intake. #1 the patient's wound will be dressed with silver alginate Kerlix Coban #2 significant PAD. He uses a walker but states he does not describe claudication. He has apparently had arterial studies done at vein and vascular we'll see if we can find these results. Paradoxically has an appointment with Dr. dew on November 6. #3 significant venous  insufficiency. Edema is well controlled. I'm uncertain whether he has had reflux studies. #4 the wound actually looked more venous and arterial although he states they started with blisters and occurred at night. No obvious trauma or cellulitis Electronic Signature(s) Signed: 07/26/2017 4:48:36 PM By: Linton Ham MD Entered By: Linton Ham on 07/26/2017 12:36:37 Justin Hancock ([id number]) -------------------------------------------------------------------------------- ROS/PFSH Details Patient Name: Justin Hancock. Date of Service: 07/26/2017 10:30 AM Medical Record Number: [id number] Patient Account Number: [id number] Date of Birth/Sex: 1926-10-08 (83 y.o. Male) Treating RN: Ahmed Prima Primary Care Provider: [place] Pink Other Clinician: Referring Provider: Lujean Amel Treating Provider/Extender: Tito Dine in Treatment: 0 Information Obtained From Patient Wound History Do you currently have one or more open woundso Yes How many open wounds do you currently haveo 4 Approximately how long have you had your woundso 3 weeks How have you been treating your wound(s) until nowo neopsorin, bandage Has your wound(s) ever healed and then re-openedo No Have you had any lab work done in the past montho No Have you tested positive for an antibiotic resistant organism (MRSA, VRE)o No Have you tested positive for osteomyelitis (bone infection)o No Have you had any tests for circulation on your legso Yes Who ordered the testo dr dew Have you had other problems associated with your woundso Swelling Constitutional Symptoms (General Health) Complaints and Symptoms: No Complaints or Symptoms Eyes Medical History: Positive for: Cataracts - surgery; Glaucoma - surgery Ear/Nose/Mouth/Throat Complaints and Symptoms: No Complaints or Symptoms Respiratory Medical History: Positive for: Sleep Apnea Cardiovascular Complaints and Symptoms: Review of System  Notes: heart murmur hyperlipidemia Medical History: Positive for: Coronary Artery Disease; Hypertension; Peripheral Venous Disease Gastrointestinal Complaints and Symptoms: Review of System Notes: ZEPHAN, BEAUCHAINE ([id number]) heartburn Endocrine Medical History: Positive for: Type II Diabetes Time with diabetes: long time Treated with: Oral agents Blood sugar tested every day: Yes Tested : Genitourinary Complaints and Symptoms: Review of System Notes: BPH CKD Musculoskeletal Complaints and Symptoms: Review of System Notes: neuromuskular disorder Medical History: Positive for: Osteoarthritis Oncologic Complaints and Symptoms: Review of System Notes: colon Medical History: Negative for: Received Chemotherapy; Received Radiation HBO Extended History Items Eyes: Eyes: Cataracts Glaucoma Immunizations Pneumococcal Vaccine: Received Pneumococcal Vaccination: Yes Implantable Devices Family and Social History Cancer: No; Diabetes: Yes - Mother; Heart Disease: Yes - Father,Mother,Paternal Grandparents; Hereditary Spherocytosis: No; Hypertension: Yes - Mother; Kidney Disease: No; Lung Disease: No; Seizures: No; Stroke: No; Thyroid Problems: No; Tuberculosis: No; Former smoker - quit 1974; Marital Status - Married; Alcohol Use: Never; Drug Use: No History; Caffeine Use: Rarely; Financial Concerns: No; Food, Clothing or Shelter Needs: No; Support System Lacking: No; Transportation Concerns: No; Advanced Directives: No; Patient does not want information on Advanced Directives; Do not resuscitate: No; Living Will: Yes (Not Provided); Medical Power of Attorney: Yes - son (Not Provided) Electronic Signature(s) Signed: 07/26/2017 4:48:36 PM By: Linton Ham MD Signed: 07/27/2017 4:35:14 PM By: Cristie Hem, Herbie [place]  E. ([id number]) Entered By: Alric Quan on 07/26/2017 16:08:38 Justin Hancock  ([id number]) -------------------------------------------------------------------------------- [place] Details Patient Name: Justin Hancock Date of Service: 07/26/2017 Medical Record Number: [id number] Patient Account Number: [id number] Date of Birth/Sex: July 04, 1927 (83 y.o. Male) Treating RN: Ahmed Prima Primary Care Provider: [place] Pink Other Clinician: Referring Provider: Lujean Amel Treating Provider/Extender: Ricard Dillon Weeks in Treatment: 0 Diagnosis Coding ICD-10 Codes Code Description [phone] Non-pressure chronic ulcer of left calf limited to breakdown of skin L97.211 Non-pressure chronic ulcer of right calf limited to breakdown of skin I87.333 Chronic venous hypertension (idiopathic) with ulcer and inflammation of bilateral lower extremity E11.51 Type 2 diabetes mellitus with diabetic peripheral angiopathy without gangrene Facility Procedures CPT4 Code: [id number] Description: [place] VISIT-LEV 3 EST PT Modifier: Quantity: 1 CPT4 Code: [id number] Description: 40973 - [place] WOUND 1ST 20 SQ CM OR < ICD-10 Diagnosis Description L97.211 Non-pressure chronic ulcer of right calf limited to breakdown Modifier: of skin Quantity: 1 CPT4 Code: [id number] Description: 68341 - [place] WOUND EA ADDL 20 SQ CM ICD-10 Diagnosis Description L97.211 Non-pressure chronic ulcer of right calf limited to breakdown Modifier: of skin Quantity: 1 Physician Procedures CPT4 Code: 9622297 Description: 98921 - WC PHYS LEVEL 4 - NEW PT ICD-10 Diagnosis Description L97.221 Non-pressure chronic ulcer of left calf limited to breakdown o L97.211 Non-pressure chronic ulcer of right calf limited to breakdown Modifier: 25 f skin of skin Quantity: 1 CPT4 Code: 1941740 Description: 97597 - WC PHYS DEBR WO ANESTH 20 SQ CM ICD-10 Diagnosis Description L97.211 Non-pressure chronic ulcer of right calf limited to breakdown Modifier: of skin Quantity: 1 CPT4 Code:  8144818 Description: [place] - WC PHYS DEBR WO ANESTH EA ADD 20 CM ICD-10 Diagnosis Description L97.211 Non-pressure chronic ulcer of right calf limited to breakdown Modifier: of skin Quantity: 1 Electronic Signature(s) Signed: 07/26/2017 1:34:11 PM By: Alric Quan Signed: 07/26/2017 4:48:36 PM By: Linton Ham MD Justin Hancock ([id number]) Entered By: Alric Quan on 07/26/2017 13:34:11

## 2017-08-02 ENCOUNTER — Ambulatory Visit (INDEPENDENT_AMBULATORY_CARE_PROVIDER_SITE_OTHER): Payer: PPO | Admitting: Vascular Surgery

## 2017-08-02 ENCOUNTER — Encounter: Payer: PPO | Attending: Internal Medicine | Admitting: Internal Medicine

## 2017-08-02 ENCOUNTER — Encounter (INDEPENDENT_AMBULATORY_CARE_PROVIDER_SITE_OTHER): Payer: Self-pay | Admitting: Vascular Surgery

## 2017-08-02 VITALS — BP 137/59 | HR 62 | Resp 18 | Ht 73.0 in | Wt 224.0 lb

## 2017-08-02 DIAGNOSIS — E785 Hyperlipidemia, unspecified: Secondary | ICD-10-CM

## 2017-08-02 DIAGNOSIS — I701 Atherosclerosis of renal artery: Secondary | ICD-10-CM | POA: Diagnosis not present

## 2017-08-02 DIAGNOSIS — M7989 Other specified soft tissue disorders: Secondary | ICD-10-CM | POA: Diagnosis not present

## 2017-08-02 DIAGNOSIS — L97201 Non-pressure chronic ulcer of unspecified calf limited to breakdown of skin: Secondary | ICD-10-CM

## 2017-08-02 DIAGNOSIS — I739 Peripheral vascular disease, unspecified: Secondary | ICD-10-CM | POA: Insufficient documentation

## 2017-08-02 DIAGNOSIS — I10 Essential (primary) hypertension: Secondary | ICD-10-CM

## 2017-08-02 DIAGNOSIS — L97211 Non-pressure chronic ulcer of right calf limited to breakdown of skin: Secondary | ICD-10-CM | POA: Diagnosis not present

## 2017-08-02 DIAGNOSIS — I87333 Chronic venous hypertension (idiopathic) with ulcer and inflammation of bilateral lower extremity: Secondary | ICD-10-CM | POA: Insufficient documentation

## 2017-08-02 DIAGNOSIS — S81801A Unspecified open wound, right lower leg, initial encounter: Secondary | ICD-10-CM | POA: Diagnosis not present

## 2017-08-02 DIAGNOSIS — S81802A Unspecified open wound, left lower leg, initial encounter: Secondary | ICD-10-CM | POA: Diagnosis not present

## 2017-08-02 DIAGNOSIS — L97221 Non-pressure chronic ulcer of left calf limited to breakdown of skin: Secondary | ICD-10-CM | POA: Insufficient documentation

## 2017-08-02 DIAGNOSIS — E1151 Type 2 diabetes mellitus with diabetic peripheral angiopathy without gangrene: Secondary | ICD-10-CM | POA: Diagnosis not present

## 2017-08-02 NOTE — Assessment & Plan Note (Signed)
Continue leg elevation and the compression performed by the wound care center.

## 2017-08-02 NOTE — Patient Instructions (Signed)

## 2017-08-02 NOTE — Assessment & Plan Note (Signed)
Bilateral.  These are consistent with venous stasis ulcerations.  We will  perform a venous reflux study at his convenience in the near future.  Continue compression and wound care as per the wound care center.  This worsening problem is concerning given his previous history of diabetes and peripheral arterial disease which will limit wound healing.

## 2017-08-02 NOTE — Assessment & Plan Note (Signed)
Stable with good perfusion at last check.

## 2017-08-02 NOTE — Progress Notes (Signed)
Patient ID: Justin Hancock, male   DOB: Feb 12, 1927, 83 y.o.   MRN: [id number]  Chief Complaint  Patient presents with  . Follow-up    venous Stasis    HPI Justin Hancock is a 83 y.o. male.  I am asked to see the patient by Dr. Clayborn Bigness for evaluation of venous stasis ulcerations.  The patient reports 4 different ulcerations developing, 2 on each leg, over the past several weeks to months.  There was no inciting event or trauma.  There was no injury.  The patient denies fever or chills.  He has been seeing the wound care center and they have been putting silver alginate and wraps on his legs which seem to have helped but not completely healed the wounds.  He reports swelling associated with this which has been worsening.  He has a previous history of peripheral arterial disease that we have checked in the past and at last check his perfusion was reasonably stable from an arterial standpoint.  We have not evaluated him for venous disease previously to my knowledge.  No chest pain or shortness of breath.  This is not particularly painful.   Current Outpatient Prescriptions  Medication Sig Dispense Refill  . clopidogrel (PLAVIX) 75 MG tablet Take 75 mg by mouth daily. am    . finasteride (PROSCAR) 5 MG tablet Take 1 tablet (5 mg total) by mouth daily. 90 tablet 4  . hydrALAZINE (APRESOLINE) 25 MG tablet Take 75 mg by mouth 3 (three) times daily. Am,lunch and pm    . hydrochlorothiazide (HYDRODIURIL) 12.5 MG tablet Take 12.5 mg by mouth daily. am    . lisinopril (PRINIVIL,ZESTRIL) 20 MG tablet TAKE ONE TABLET BY MOUTH TWICE A DAY    . loperamide (IMODIUM) 2 MG capsule Take 2 mg by mouth as needed for diarrhea or loose stools. Am and pm    . lovastatin (MEVACOR) 20 MG tablet Take 20 mg by mouth 2 (two) times daily. Am and bedtime    . metFORMIN (GLUCOPHAGE) 500 MG tablet Take 500 mg by mouth 4 (four) times daily. Am,lunch,dinner,pm    . metoprolol succinate (TOPROL-XL) 50 MG 24 hr  tablet Take 50 mg by mouth 2 (two) times daily. Take with or immediately following a meal. /am and pm    . Multiple Vitamin (MULTIVITAMIN) tablet Take 1 tablet by mouth daily.    . Multiple Vitamins-Minerals (PRESERVISION AREDS 2+MULTI VIT PO) Take by mouth. Am and pm    . tamsulosin (FLOMAX) 0.4 MG CAPS capsule Take by mouth. am    . triamcinolone cream (KENALOG) 0.1 % Apply topically.    . vitamin C (ASCORBIC ACID) 500 MG tablet Take by mouth.    . furosemide (LASIX) 20 MG tablet Take 20 mg by mouth. am     No current facility-administered medications for this visit.         Past Medical History:  Diagnosis Date  . Arthritis    handsd  . Balance problem    uses cane or walker  . BPH (benign prostatic hyperplasia)   . Cancer ([place])    colon  . Chronic kidney disease 2015   renal stentx1  . Coronary artery disease    multiple stentsx7  . Diabetes mellitus without complication ([place])   . Heart murmur   . Heartburn   . History of hiatal hernia   . HLD (hyperlipidemia)   . Hypertension   . Neuromuscular disorder (HCC)    neuropathy  .  Peripheral vascular disease ([place])   . Sleep apnea    no CPAP  . Sleeps in sitting position due to orthopnea          Past Surgical History:  Procedure Laterality Date  . ANKLE FRACTURE SURGERY    . APPENDECTOMY    . CATARACT EXTRACTION W/PHACO Right 11/24/2016   Procedure: CATARACT EXTRACTION PHACO AND INTRAOCULAR LENS PLACEMENT (IOC)  Right eye Diabetic  toric lens;  Surgeon: Leandrew Koyanagi, MD;  Location: [facility];  Service: Ophthalmology;  Laterality: Right;  Diabetic - oral meds Toric lens malyugin  . CATARACT EXTRACTION W/PHACO Left 01/26/2017   Procedure: CATARACT EXTRACTION PHACO AND INTRAOCULAR LENS PLACEMENT (IOC) Left Complicated  Daibetic toric lens;  Surgeon: Leandrew Koyanagi, MD;  Location: [facility];  Service: Ophthalmology;  Laterality: Left;   Diabetic-oral meds Malyugin Toric Lens  . CHOLECYSTECTOMY    . colon cancer surgery     x 2/ resection  . HERNIA REPAIR    . LEG SURGERY     fracture  . PERIPHERAL VASCULAR CATHETERIZATION N/A 11/27/2015   Procedure: Abdominal Aortogram w/Lower Extremity;  Surgeon: Algernon Huxley, MD;  Location: [facility];  Service: Cardiovascular;  Laterality: N/A;  . PERIPHERAL VASCULAR CATHETERIZATION  11/27/2015   Procedure: Lower Extremity Intervention;  Surgeon: Algernon Huxley, MD;  Location: [facility];  Service: Cardiovascular;;  . TONSILLECTOMY      Social History      Social History  Substance Use Topics  . Smoking status: Former Smoker    Quit date: 09/27/1953  . Smokeless tobacco: Never Used  . Alcohol use No  Widower  Family History      Family History  Problem Relation Age of Onset  . Kidney Stones Father   . Prostate cancer Neg Hx   . Kidney disease Neg Hx   . Bladder Cancer Neg Hx   No bleeding or clotting disorders       Allergies  Allergen Reactions  . Penicillins Hives    And fever     REVIEW OF SYSTEMS (Negative unless checked)  Constitutional: [] Weight loss  [] Fever  [] Chills Cardiac: [] Chest pain   [] Chest pressure   [x] Palpitations   [] Shortness of breath when laying flat   [] Shortness of breath at rest   [] Shortness of breath with exertion. Vascular:  [] Pain in legs with walking   [] Pain in legs at rest   [] Pain in legs when laying flat   [] Claudication   [] Pain in feet when walking  [] Pain in feet at rest  [] Pain in feet when laying flat   [] History of DVT   [] Phlebitis   [x] Swelling in legs   [x] Varicose veins   [x] Non-healing ulcers Pulmonary:   [] Uses home oxygen   [] Productive cough   [] Hemoptysis   [] Wheeze  [] COPD   [] Asthma Neurologic:  [] Dizziness  [] Blackouts   [] Seizures   [] History of stroke   [] History of TIA  [] Aphasia   [] Temporary blindness   [] Dysphagia   [] Weakness or numbness in arms   [] Weakness or  numbness in legs Musculoskeletal:  [x] Arthritis   [] Joint swelling   [] Joint pain   [] Low back pain Hematologic:  [] Easy bruising  [] Easy bleeding   [] Hypercoagulable state   [] Anemic   Gastrointestinal:  [] Blood in stool   [] Vomiting blood  [] Gastroesophageal reflux/heartburn   [] Abdominal pain Genitourinary:  [x] Chronic kidney disease   [] Difficult urination  [] Frequent urination  [] Burning with urination   [] Hematuria Skin:  []   Rashes   [x] Ulcers   [x] Wounds Psychological:  [] History of anxiety   []  History of major depression.       Physical Exam BP (!) 137/59 (BP Location: Right Arm)   Pulse 62   Resp 18   Ht 6\' 1"  (1.854 m)   Wt 101.6 kg (224 lb)   BMI 29.55 kg/m  Gen:  WD/WN, NAD Head: [place]/AT, No temporalis wasting.  Ear/Nose/Throat: Hearing grossly intact, nares w/o erythema or drainage, oropharynx w/o Erythema/Exudate Eyes: Conjunctiva clear, sclera non-icteric  Neck: trachea midline.  No JVD.  Pulmonary:  Good air movement, respirations not labored, no use of accessory muscles Cardiac: Irregular Vascular:  Vessel Right Left  Radial Palpable Palpable                          PT 1+ Palpable Trace Palpable  DP 1+ Palpable 1+ Palpable   Gastrointestinal: soft, non-tender/non-distended.  Musculoskeletal: M/S 5/5 throughout.  Moderate venous stasis changes bilaterally with superficial ulcerations/blistering present on the medial lower leg several centimeters above the ankle.  With no deformity or atrophy.  1-2+ bilateral lower extremity edema. Neurologic: Sensation grossly intact in extremities.  Symmetrical.  Speech is fluent. Motor exam as listed above. Psychiatric: Judgment intact, Mood & affect appropriate for pt's clinical situation. Dermatologic: Medial lower leg ulcerations as described above   Radiology No results found.  Labs No results found for this or any previous visit (from the past 2160 hour(s)).  Assessment/Plan: Essential hypertension blood  pressure control important in reducing the progression of atherosclerotic disease. On appropriate oral medications.   Hyperlipidemia lipid control important in reducing the progression of atherosclerotic disease. Continue statin therapy  RAS (renal artery stenosis) ([place]) Checked earlier this year and found to be reasonably stable.  Scheduled to be rechecked next summer.  Swelling of limb Continue leg elevation and the compression performed by the wound care center.  Peripheral vascular disease ([place]) Stable with good perfusion at last check.  Lower limb ulcer, calf, unspecified laterality, limited to breakdown of skin (HCC) Bilateral.  These are consistent with venous stasis ulcerations.  We will  perform a venous reflux study at his convenience in the near future.  Continue compression and wound care as per the wound care center.  This worsening problem is concerning given his previous history of diabetes and peripheral arterial disease which will limit wound healing.      Leotis Pain 08/02/2017, 2:45 PM   This note was created with Dragon medical transcription system.  Any errors from dictation are unintentional.

## 2017-08-05 ENCOUNTER — Ambulatory Visit (INDEPENDENT_AMBULATORY_CARE_PROVIDER_SITE_OTHER): Payer: PPO

## 2017-08-05 ENCOUNTER — Ambulatory Visit (INDEPENDENT_AMBULATORY_CARE_PROVIDER_SITE_OTHER): Payer: PPO | Admitting: Vascular Surgery

## 2017-08-05 ENCOUNTER — Encounter (INDEPENDENT_AMBULATORY_CARE_PROVIDER_SITE_OTHER): Payer: Self-pay

## 2017-08-05 ENCOUNTER — Encounter (INDEPENDENT_AMBULATORY_CARE_PROVIDER_SITE_OTHER): Payer: Self-pay | Admitting: Vascular Surgery

## 2017-08-05 VITALS — BP 198/79 | HR 76 | Resp 17 | Ht 75.0 in | Wt 222.0 lb

## 2017-08-05 DIAGNOSIS — L97201 Non-pressure chronic ulcer of unspecified calf limited to breakdown of skin: Secondary | ICD-10-CM

## 2017-08-05 DIAGNOSIS — I872 Venous insufficiency (chronic) (peripheral): Secondary | ICD-10-CM | POA: Diagnosis not present

## 2017-08-05 DIAGNOSIS — I890 Lymphedema, not elsewhere classified: Secondary | ICD-10-CM | POA: Diagnosis not present

## 2017-08-05 NOTE — Progress Notes (Signed)
Subjective:    Patient ID: Justin Hancock, male    DOB: 1926-11-27, 83 y.o.   MRN: [id number] Chief Complaint  Patient presents with  . Follow-up    Bilateral reflux study   Patient presents to review vascular studies.  The patient was last seen in our office on August 02, 2017 for evaluation of bilateral lower extremity ulcerations.  The patient continues to receive treatment from the wound center.  He presents today wearing bilateral ulnar wraps.  The patient's next wound appointment is next week.  The patient underwent a bilateral lower extremity venous reflux exam which was notable for venous reflux in the right common femoral vein, saphenous femoral junction, popliteal and the great saphenous vein.  Venous incompetence noted in the left common femoral vein, femoral vein, and the saphenofemoral junction.  No evidence of deep or superficial vein thrombosis.  Patient denies any fever, nausea or vomiting.  The patient states that he will be leaving for [place] and returning after the first of the year.   Review of Systems  Constitutional: Negative.   HENT: Negative.   Eyes: Negative.   Respiratory: Negative.   Cardiovascular: Positive for leg swelling.       Bilateral lower extremity ulcerations  Gastrointestinal: Negative.   Endocrine: Negative.   Genitourinary: Negative.   Musculoskeletal: Negative.   Skin: Negative.   Allergic/Immunologic: Negative.   Neurological: Negative.   Hematological: Negative.   Psychiatric/Behavioral: Negative.       Objective:   Physical Exam  Constitutional: He is oriented to person, place, and time. He appears well-developed and well-nourished. No distress.  HENT:  Head: Normocephalic and atraumatic.  Eyes: Conjunctivae are normal. Pupils are equal, round, and reactive to light.  Neck: Normal range of motion.  Cardiovascular: Normal rate, regular rhythm, normal heart sounds and intact distal pulses.  Pulses:      Radial pulses are 2+ on the  right side, and 2+ on the left side.       Dorsalis pedis pulses are 1+ on the right side, and 1+ on the left side.       Posterior tibial pulses are 1+ on the right side, and 1+ on the left side.  Pulmonary/Chest: Effort normal and breath sounds normal.  Musculoskeletal: Normal range of motion. He exhibits no edema.  Neurological: He is alert and oriented to person, place, and time.  Skin: He is not diaphoretic.     Bilateral medial ankle ulcerations.  Noninfected.  No cellulitis.   Psychiatric: He has a normal mood and affect. His behavior is normal. Judgment and thought content normal.  Vitals reviewed.  BP (!) 198/79 (BP Location: Right Arm, Patient Position: Sitting)   Pulse 76   Resp 17   Ht 6\' 3"  (1.905 m)   Wt 222 lb (100.7 kg)   BMI 27.75 kg/m   Past Medical History:  Diagnosis Date  . Arthritis    handsd  . Balance problem    uses cane or walker  . BPH (benign prostatic hyperplasia)   . Cancer ([place])    colon  . Chronic kidney disease 2015   renal stentx1  . Coronary artery disease    multiple stentsx7  . Diabetes mellitus without complication ([place])   . Heart murmur   . Heartburn   . History of hiatal hernia   . HLD (hyperlipidemia)   . Hypertension   . Neuromuscular disorder (HCC)    neuropathy  . Peripheral vascular disease ([place])   .  Sleep apnea    no CPAP  . Sleeps in sitting position due to orthopnea    Social History   Socioeconomic History  . Marital status: Married    Spouse name: Not on file  . Number of children: Not on file  . Years of education: Not on file  . Highest education level: Not on file  Social Needs  . Financial resource strain: Not on file  . Food insecurity - worry: Not on file  . Food insecurity - inability: Not on file  . Transportation needs - medical: Not on file  . Transportation needs - non-medical: Not on file  Occupational History  . Not on file  Tobacco Use  . Smoking status: Former Smoker    Last attempt to  quit: 09/27/1953    Years since quitting: 63.8  . Smokeless tobacco: Never Used  Substance and Sexual Activity  . Alcohol use: No  . Drug use: No  . Sexual activity: Not on file  Other Topics Concern  . Not on file  Social History Narrative  . Not on file   Past Surgical History:  Procedure Laterality Date  . ANKLE FRACTURE SURGERY    . APPENDECTOMY    . CHOLECYSTECTOMY    . colon cancer surgery     x 2/ resection  . HERNIA REPAIR    . LEG SURGERY     fracture  . TONSILLECTOMY     Family History  Problem Relation Age of Onset  . Kidney Stones Father   . Prostate cancer Neg Hx   . Kidney disease Neg Hx   . Bladder Cancer Neg Hx    Allergies  Allergen Reactions  . Penicillins Hives    And fever      Assessment & Plan:  Patient presents to review vascular studies.  The patient was last seen in our office on August 02, 2017 for evaluation of bilateral lower extremity ulcerations.  The patient continues to receive treatment from the wound center.  He presents today wearing bilateral ulnar wraps.  The patient's next wound appointment is next week.  The patient underwent a bilateral lower extremity venous reflux exam which was notable for venous reflux in the right common femoral vein, saphenous femoral junction, popliteal and the great saphenous vein.  Venous incompetence noted in the left common femoral vein, femoral vein, and the saphenofemoral junction.  No evidence of deep or superficial vein thrombosis.  Patient denies any fever, nausea or vomiting.  The patient states that he will be leaving for [place] and returning after the first of the year.  1. Chronic venous insufficiency - New Patient with venous incompetence to the right great saphenous vein. The patient is likely to benefit from endovenous laser ablation. I have discussed the risks and benefits of the procedure. The risks primarily include DVT, recanalization, bleeding, infection, and inability to gain access. At this  time the patient would like to discuss this with his wound doctor. The patient also states that he is going to [place] until after the new year. He would like to revisit this at that time.  2. Lymphedema - New The patient would benefit from a lymphedema pump due to his lymphedema as well as venous incompetence to the deep system located bilaterally. At this time the patient would like to discuss this with his wound doctor. The patient also states that he is going to [place] until after the new year. He would like to revisit this at that time.  3. Lower limb ulcer, calf, unspecified laterality, limited to breakdown of skin ([place]) - Stable The patient is currently receiving wound care from the wound clinic. Encouraged patient to elevate his legs heart level or higher as much as possible. The patient will follow up with [place] after the new year  Current Outpatient Medications on File Prior to Visit  Medication Sig Dispense Refill  . clopidogrel (PLAVIX) 75 MG tablet Take 75 mg by mouth daily. am    . finasteride (PROSCAR) 5 MG tablet Take 1 tablet (5 mg total) by mouth daily. 90 tablet 4  . furosemide (LASIX) 20 MG tablet Take 20 mg by mouth. am    . hydrALAZINE (APRESOLINE) 25 MG tablet Take 75 mg by mouth 3 (three) times daily. Am,lunch and pm    . hydrochlorothiazide (HYDRODIURIL) 12.5 MG tablet Take 12.5 mg by mouth daily. am    . lisinopril (PRINIVIL,ZESTRIL) 20 MG tablet TAKE ONE TABLET BY MOUTH TWICE A DAY    . loperamide (IMODIUM) 2 MG capsule Take 2 mg by mouth as needed for diarrhea or loose stools. Am and pm    . lovastatin (MEVACOR) 20 MG tablet Take 20 mg by mouth 2 (two) times daily. Am and bedtime    . metFORMIN (GLUCOPHAGE) 500 MG tablet Take 500 mg by mouth 4 (four) times daily. Am,lunch,dinner,pm    . metoprolol succinate (TOPROL-XL) 50 MG 24 hr tablet Take 50 mg by mouth 2 (two) times daily. Take with or immediately following a meal. /am and pm    . Multiple Vitamin (MULTIVITAMIN)  tablet Take 1 tablet by mouth daily.    . Multiple Vitamins-Minerals (PRESERVISION AREDS 2+MULTI VIT PO) Take by mouth. Am and pm    . triamcinolone cream (KENALOG) 0.1 % Apply topically.    . vitamin C (ASCORBIC ACID) 500 MG tablet Take by mouth.     No current facility-administered medications on file prior to visit.     There are no Patient Instructions on file for this visit. No Follow-up on file.   Neely Kammerer A Elayne Gruver, PA-C

## 2017-08-07 NOTE — Progress Notes (Signed)
Justin Hancock ([id number]) Visit Report for 08/02/2017 HPI Details Patient Name: Justin Hancock, Justin Hancock. Date of Service: 08/02/2017 2:15 PM Medical Record Number: [id number] Patient Account Number: [id number] Date of Birth/Sex: 07/12/1927 (82 y.o. Male) Treating RN: Ahmed Prima Primary Care Provider: [place] Pink Other Clinician: Referring Provider: [place] Pink Treating Provider/Extender: Ricard Dillon Weeks in Treatment: 1 History of Present Illness HPI Description: 07/26/17; Mr. Zetina is a gentleman who follows with Dr. Leotis [facility] vein and vascular. He has known atherosclerotic disease with among other things a left renal artery stent. He takes Plavix. He is a type II diabetic on metformin. He also has multiple coronary artery stents and known peripheral vascular disease. The patient states that he is followed by Dr. dew with apparent serial arterial studies in his legs he thinks the last one was done a year ago. He was a remote smoker quitting many years ago. The patient tells me that roughly 2-3 weeks ago he developed raised blisters o2 in his left leg and o2 in his right leg.Marland Kitchen He states that these happened at night and there is no known trauma. The edema in his legs was not clearly much larger than it is now. He does not describe claudication. He is been using Neosporin and Band-Aids to the wounds. ABIs in our clinics were 0.58 bilaterally 08/02/17; patient saw Dr. dew this morning and according to the patient is going for reflux studies on Friday. His wounds on his bilateral lower legs are mostly closed. We use Kerlix Coban compression. His edema is well controlled. He tells me he does have stockings at home which she will bring next week and actually reapply them after the studies he has had vein and vascular on Friday. In any case he should be healed next week Electronic Signature(s) Signed: 08/03/2017 8:07:20 AM By: Linton Ham MD Entered By: Linton Ham on 08/02/2017  14:57:57 [name] ([id number]) -------------------------------------------------------------------------------- Physical Exam Details Patient Name: Justin Hancock. Date of Service: 08/02/2017 2:15 PM Medical Record Number: [id number] Patient Account Number: [id number] Date of Birth/Sex: 06-04-1927 (83 y.o. Male) Treating RN: Ahmed Prima Primary Care Provider: [place] Pink Other Clinician: Referring Provider: [place] Pink Treating Provider/Extender: Ricard Dillon Weeks in Treatment: 1 Constitutional Sitting or standing Blood Pressure is within target range for patient.. Pulse regular and within target range for patient.Marland Kitchen Respirations regular, non-labored and within target range.. Temperature is normal and within the target range for the patient.Marland Kitchen appears in no distress. Notes When exam; the patient has 2 wounds on the medial lower aspect of the left leg which are largely closed over. On the right leg there is 2 or 3 areas that are also closed over. There is surface eschar here which is slight and I don't think he needs specific dressings here. Electronic Signature(s) Signed: 08/03/2017 8:07:20 AM By: Linton Ham MD Entered By: Linton Ham on 08/02/2017 14:59:06 [name] ([id number]) -------------------------------------------------------------------------------- Physician Orders Details Patient Name: Justin Hancock. Date of Service: 08/02/2017 2:15 PM Medical Record Number: [id number] Patient Account Number: [id number] Date of Birth/Sex: May 12, 1927 (83 y.o. Male) Treating RN: Ahmed Prima Primary Care Provider: [place] Pink Other Clinician: Referring Provider: [place] Pink Treating Provider/Extender: Tito Dine in Treatment: 1 Verbal / Phone Orders: Yes Clinician: Pinkerton, Debi Read Back and Verified: Yes Diagnosis Coding Wound Cleansing Wound #1 Right,Medial Lower Leg o Clean wound with Normal Saline. o Cleanse wound  with mild soap and water Wound #2 Left,Proximal,Medial Lower Leg o Clean wound with Normal  Saline. o Cleanse wound with mild soap and water Wound #3 Left,Distal,Medial Lower Leg o Clean wound with Normal Saline. o Cleanse wound with mild soap and water Anesthetic Wound #1 Right,Medial Lower Leg o Topical Lidocaine 4% cream applied to wound bed prior to debridement Wound #2 Left,Proximal,Medial Lower Leg o Topical Lidocaine 4% cream applied to wound bed prior to debridement Wound #3 Left,Distal,Medial Lower Leg o Topical Lidocaine 4% cream applied to wound bed prior to debridement Skin Barriers/Peri-Wound Care Wound #1 Right,Medial Lower Leg o Triamcinolone Acetonide Ointment Wound #2 Left,Proximal,Medial Lower Leg o Triamcinolone Acetonide Ointment Wound #3 Left,Distal,Medial Lower Leg o Triamcinolone Acetonide Ointment Secondary Dressing Wound #1 Right,Medial Lower Leg o Dry Gauze Wound #2 Left,Proximal,Medial Lower Leg o Dry Gauze Wound #3 Left,Distal,Medial Lower Leg o Dry Gauze Dressing Change Frequency JAQUAVEON, BILAL. ([id number]) Wound #1 Right,Medial Lower Leg o Change dressing every week Wound #2 Left,Proximal,Medial Lower Leg o Change dressing every week Wound #3 Left,Distal,Medial Lower Leg o Change dressing every week Follow-up Appointments Wound #1 Right,Medial Lower Leg o Return Appointment in 1 week. Wound #2 Left,Proximal,Medial Lower Leg o Return Appointment in 1 week. Wound #3 Left,Distal,Medial Lower Leg o Return Appointment in 1 week. Edema Control Wound #1 Right,Medial Lower Leg o Kerlix and Coban - Bilateral - unna to anchor after appt at [facility] on Friday for vein studies put on your compression stockings o Elevate legs to the level of the heart and pump ankles as often as possible Wound #2 Left,Proximal,Medial Lower Leg o Kerlix and Coban - Bilateral - unna to anchor after appt at [facility] on Friday for  vein studies put on your compression stockings o Elevate legs to the level of the heart and pump ankles as often as possible Wound #3 Left,Distal,Medial Lower Leg o Kerlix and Coban - Bilateral - unna to anchor after appt at [facility] on Friday for vein studies put on your compression stockings o Elevate legs to the level of the heart and pump ankles as often as possible Additional Orders / Instructions Wound #1 Right,Medial Lower Leg o Increase protein intake. Wound #2 Left,Proximal,Medial Lower Leg o Increase protein intake. Wound #3 Left,Distal,Medial Lower Leg o Increase protein intake. Electronic Signature(s) Signed: 08/03/2017 8:07:20 AM By: Linton Ham MD Signed: 08/05/2017 4:36:47 PM By: Alric Quan Entered By: Alric Quan on 08/02/2017 15:06:11 [name] ([id number]) -------------------------------------------------------------------------------- Problem List Details Patient Name: KATELYN, KOHLMEYER. Date of Service: 08/02/2017 2:15 PM Medical Record Number: [id number] Patient Account Number: [id number] Date of Birth/Sex: 10-17-1926 (83 y.o. Male) Treating RN: Ahmed Prima Primary Care Provider: [place] Pink Other Clinician: Referring Provider: [place] Pink Treating Provider/Extender: Tito Dine in Treatment: 1 Active Problems ICD-10 Encounter Code Description Active Date Diagnosis L97.221 Non-pressure chronic ulcer of left calf limited to breakdown of skin 07/26/2017 Yes L97.211 Non-pressure chronic ulcer of right calf limited to breakdown of skin 07/26/2017 Yes I87.333 Chronic venous hypertension (idiopathic) with ulcer and 07/26/2017 Yes inflammation of bilateral lower extremity E11.51 Type 2 diabetes mellitus with diabetic peripheral angiopathy without 07/26/2017 Yes gangrene Inactive Problems Resolved Problems Electronic Signature(s) Signed: 08/03/2017 8:07:20 AM By: Linton Ham MD Entered By: Linton Ham on  08/02/2017 14:50:39 [name] ([id number]) -------------------------------------------------------------------------------- Progress Note Details Patient Name: [name]. Date of Service: 08/02/2017 2:15 PM Medical Record Number: [id number] Patient Account Number: [id number] Date of Birth/Sex: 06/12/1927 (83 y.o. Male) Treating RN: Ahmed Prima Primary Care Provider: [place] Pink Other Clinician: Referring Provider: [place] Pink Treating Provider/Extender: Ricard Dillon  Weeks in Treatment: 1 Subjective History of Present Illness (HPI) 07/26/17; Mr. Sigal is a gentleman who follows with Dr. Leotis [facility] vein and vascular. He has known atherosclerotic disease with among other things a left renal artery stent. He takes Plavix. He is a type II diabetic on metformin. He also has multiple coronary artery stents and known peripheral vascular disease. The patient states that he is followed by Dr. dew with apparent serial arterial studies in his legs he thinks the last one was done a year ago. He was a remote smoker quitting many years ago. The patient tells me that roughly 2-3 weeks ago he developed raised blisters o2 in his left leg and o2 in his right leg.Marland Kitchen He states that these happened at night and there is no known trauma. The edema in his legs was not clearly much larger than it is now. He does not describe claudication. He is been using Neosporin and Band-Aids to the wounds. ABIs in our clinics were 0.58 bilaterally 08/02/17; patient saw Dr. dew this morning and according to the patient is going for reflux studies on Friday. His wounds on his bilateral lower legs are mostly closed. We use Kerlix Coban compression. His edema is well controlled. He tells me he does have stockings at home which she will bring next week and actually reapply them after the studies he has had vein and vascular on Friday. In any case he should be healed next  week Objective Constitutional Sitting or standing Blood Pressure is within target range for patient.. Pulse regular and within target range for patient.Marland Kitchen Respirations regular, non-labored and within target range.. Temperature is normal and within the target range for the patient.Marland Kitchen appears in no distress. Vitals Time Taken: 2:28 PM, Height: 73 in, Weight: 225.6 lbs, BMI: 29.8, Temperature: 97.6 F, Pulse: 60 bpm, Respiratory Rate: 16 breaths/min, Blood Pressure: 138/42 mmHg. General Notes: When exam; the patient has 2 wounds on the medial lower aspect of the left leg which are largely closed over. On the right leg there is 2 or 3 areas that are also closed over. There is surface eschar here which is slight and I don't think he needs specific dressings here. Integumentary (Hair, Skin) Wound #1 status is Open. Original cause of wound was Gradually Appeared. The wound is located on the Right,Medial Lower Leg. The wound measures 0.1cm length x 0.1cm width x 0.1cm depth; 0.008cm^2 area and 0.001cm^3 volume. There is a large amount of serous drainage noted. The wound margin is distinct with the outline attached to the wound base. There is no granulation within the wound bed. There is a large (67-100%) amount of necrotic tissue within the wound bed including Godek, Rustin E. ([id number]) Broussard and [place]. The periwound skin appearance exhibited: Maceration. Periwound temperature was noted as No Abnormality. The periwound has tenderness on palpation. Wound #2 status is Open. Original cause of wound was Gradually Appeared. The wound is located on the Left,Proximal,Medial Lower Leg. The wound measures 0.1cm length x 0.1cm width x 0.1cm depth; 0.008cm^2 area and 0.001cm^3 volume. Wound #3 status is Open. Original cause of wound was Gradually Appeared. The wound is located on the Left,Distal,Medial Lower Leg. The wound measures 0.5cm length x 0.1cm width x 0.1cm depth; 0.039cm^2 area and  0.004cm^3 volume. Assessment Active Problems ICD-10 L97.221 - Non-pressure chronic ulcer of left calf limited to breakdown of skin L97.211 - Non-pressure chronic ulcer of right calf limited to breakdown of skin I87.333 - Chronic venous hypertension (idiopathic) with ulcer  and inflammation of bilateral lower extremity E11.51 - Type 2 diabetes mellitus with diabetic peripheral angiopathy without gangrene Plan Wound Cleansing: Wound #1 Right,Medial Lower Leg: Clean wound with Normal Saline. Cleanse wound with mild soap and water Wound #2 Left,Proximal,Medial Lower Leg: Clean wound with Normal Saline. Cleanse wound with mild soap and water Wound #3 Left,Distal,Medial Lower Leg: Clean wound with Normal Saline. Cleanse wound with mild soap and water Anesthetic: Wound #1 Right,Medial Lower Leg: Topical Lidocaine 4% cream applied to wound bed prior to debridement Wound #2 Left,Proximal,Medial Lower Leg: Topical Lidocaine 4% cream applied to wound bed prior to debridement Wound #3 Left,Distal,Medial Lower Leg: Topical Lidocaine 4% cream applied to wound bed prior to debridement Skin Barriers/Peri-Wound Care: Wound #1 Right,Medial Lower Leg: Triamcinolone Acetonide Ointment Wound #2 Left,Proximal,Medial Lower Leg: Triamcinolone Acetonide Ointment Wound #3 Left,Distal,Medial Lower Leg: Triamcinolone Acetonide Ointment Secondary Dressing: Wound #1 Right,Medial Lower Leg: ABD pad Wound #2 Left,Proximal,Medial Lower Leg: KATELYN, KOHLMEYER E. ([id number]) ABD pad Wound #3 Left,Distal,Medial Lower Leg: ABD pad Dressing Change Frequency: Wound #1 Right,Medial Lower Leg: Change dressing every week Wound #2 Left,Proximal,Medial Lower Leg: Change dressing every week Wound #3 Left,Distal,Medial Lower Leg: Change dressing every week Follow-up Appointments: Wound #1 Right,Medial Lower Leg: Return Appointment in 1 week. Wound #2 Left,Proximal,Medial Lower Leg: Return Appointment in 1  week. Wound #3 Left,Distal,Medial Lower Leg: Return Appointment in 1 week. Edema Control: Wound #1 Right,Medial Lower Leg: Kerlix and Coban - Bilateral - unna to anchor Elevate legs to the level of the heart and pump ankles as often as possible Wound #2 Left,Proximal,Medial Lower Leg: Kerlix and Coban - Bilateral - unna to anchor Elevate legs to the level of the heart and pump ankles as often as possible Wound #3 Left,Distal,Medial Lower Leg: Kerlix and Coban - Bilateral - unna to anchor Elevate legs to the level of the heart and pump ankles as often as possible Additional Orders / Instructions: Wound #1 Right,Medial Lower Leg: Increase protein intake. Wound #2 Left,Proximal,Medial Lower Leg: Increase protein intake. Wound #3 Left,Distal,Medial Lower Leg: Increase protein intake. #1 with simply put his legs back and compression today. There is a single area on the left anterior leg which we will specifically dressed. #2 he is going for reflux studies and if they need to take off the wraps he can use his own stockings. #3 LC 1 more time next week I'm expecting him to be closed to be discharged into his own stockings Electronic Signature(s) Signed: 08/03/2017 8:07:20 AM By: Linton Ham MD Entered By: Linton Ham on 08/02/2017 15:00:58 [name] ([id number]) -------------------------------------------------------------------------------- [place] Details Patient Name: [name]. Date of Service: 08/02/2017 Medical Record Number: [id number] Patient Account Number: [id number] Date of Birth/Sex: 08-09-1927 (83 y.o. Male) Treating RN: Ahmed Prima Primary Care Provider: [place] Pink Other Clinician: Referring Provider: [place] Pink Treating Provider/Extender: Ricard Dillon Weeks in Treatment: 1 Diagnosis Coding ICD-10 Codes Code Description [phone] Non-pressure chronic ulcer of left calf limited to breakdown of skin L97.211 Non-pressure chronic ulcer  of right calf limited to breakdown of skin I87.333 Chronic venous hypertension (idiopathic) with ulcer and inflammation of bilateral lower extremity E11.51 Type 2 diabetes mellitus with diabetic peripheral angiopathy without gangrene Facility Procedures CPT4 Code: [id number] Description: 99214 - WOUND CARE VISIT-LEV 4 EST PT Modifier: Quantity: 1 Physician Procedures CPT4: Description Modifier Quantity Code [id number] - WC PHYS LEVEL 2 - EST PT 1 ICD-10 Diagnosis Description L97.221 Non-pressure chronic ulcer of left calf limited to breakdown of skin I87.333  Chronic venous hypertension (idiopathic) with ulcer and  inflammation of bilateral lower extremity Electronic Signature(s) Signed: 08/02/2017 4:20:12 PM By: Alric Quan Signed: 08/03/2017 8:07:20 AM By: Linton Ham MD Entered By: Alric Quan on 08/02/2017 16:20:12

## 2017-08-07 NOTE — Progress Notes (Signed)
Justin Hancock, Justin Hancock ([id number]) Visit Report for 08/02/2017 Arrival Information Details Patient Name: Justin Hancock, Justin Hancock. Date of Service: 08/02/2017 2:15 PM Medical Record Number: [id number] Patient Account Number: [id number] Date of Birth/Sex: Jun 25, 1927 (83 y.o. Male) Treating RN: Ahmed Prima Primary Care Nattie Lazenby: [place] Pink Other Clinician: Referring Kaveon Blatz: [place] Pink Treating Anai Lipson/Extender: Tito Dine in Treatment: 1 Visit Information History Since Last Visit All ordered tests and consults were completed: No Patient Arrived: Justin Hancock Added or deleted any medications: No Arrival Time: 14:24 Any new allergies or adverse reactions: No Accompanied By: SELF Had a fall or experienced change in No Transfer Assistance: EasyPivot Patient activities of daily living that may affect Lift risk of falls: Patient Identification Verified: Yes Signs or symptoms of abuse/neglect since last visito No Secondary Verification Process Yes Hospitalized since last visit: No Completed: Has Dressing in Place as Prescribed: Yes Patient Requires Transmission-Based No Precautions: Has Compression in Place as Prescribed: Yes Patient Has Alerts: Yes Pain Present Now: No Patient Alerts: Patient on Blood Thinner DM II Plavix Electronic Signature(s) Signed: 08/05/2017 4:36:47 PM By: Alric Quan Entered By: Alric Quan on 08/02/2017 14:28:27 Justin Hancock ([id number]) -------------------------------------------------------------------------------- Clinic Level of Care Assessment Details Patient Name: Justin Hancock. Date of Service: 08/02/2017 2:15 PM Medical Record Number: [id number] Patient Account Number: [id number] Date of Birth/Sex: 05-02-1927 (83 y.o. Male) Treating RN: Ahmed Prima Primary Care Amaro Mangold: [place] Pink Other Clinician: Referring Loreley Schwall: [place] Pink Treating Monterrio Gerst/Extender: Tito Dine in Treatment: 1 Clinic Level of  Care Assessment Items TOOL 4 Quantity Score X - Use when only an EandM is performed on FOLLOW-UP visit 1 0 ASSESSMENTS - Nursing Assessment / Reassessment X - Reassessment of Co-morbidities (includes updates in patient status) 1 10 X- 1 5 Reassessment of Adherence to Treatment Plan ASSESSMENTS - Wound and Skin Assessment / Reassessment []  - Simple Wound Assessment / Reassessment - one wound 0 X- 3 5 Complex Wound Assessment / Reassessment - multiple wounds []  - 0 Dermatologic / Skin Assessment (not related to wound area) ASSESSMENTS - Focused Assessment []  - Circumferential Edema Measurements - multi extremities 0 []  - 0 Nutritional Assessment / Counseling / Intervention []  - 0 Lower Extremity Assessment (monofilament, tuning fork, pulses) []  - 0 Peripheral Arterial Disease Assessment (using hand held doppler) ASSESSMENTS - Ostomy and/or Continence Assessment and Care []  - Incontinence Assessment and Management 0 []  - 0 Ostomy Care Assessment and Management (repouching, etc.) PROCESS - Coordination of Care X - Simple Patient / Family Education for ongoing care 1 15 []  - 0 Complex (extensive) Patient / Family Education for ongoing care []  - 0 Staff obtains Programmer, systems, Records, Test Results / Process Orders []  - 0 Staff telephones HHA, Nursing Homes / Clarify orders / etc []  - 0 Routine Transfer to another Facility (non-emergent condition) []  - 0 Routine Hospital Admission (non-emergent condition) []  - 0 New Admissions / Biomedical engineer / Ordering NPWT, Apligraf, etc. []  - 0 Emergency Hospital Admission (emergent condition) X- 1 10 Simple Discharge Coordination Justin Hancock, BURNSIDE. ([id number]) []  - 0 Complex (extensive) Discharge Coordination PROCESS - Special Needs []  - Pediatric / Minor Patient Management 0 []  - 0 Isolation Patient Management []  - 0 Hearing / Language / Visual special needs []  - 0 Assessment of Community assistance (transportation, D/C  planning, etc.) []  - 0 Additional assistance / Altered mentation []  - 0 Support Surface(s) Assessment (bed, cushion, seat, etc.) INTERVENTIONS - Wound Cleansing / Measurement []  - Simple Wound Cleansing - one wound  0 X- 3 5 Complex Wound Cleansing - multiple wounds X- 1 5 Wound Imaging (photographs - any number of wounds) []  - 0 Wound Tracing (instead of photographs) []  - 0 Simple Wound Measurement - one wound X- 3 5 Complex Wound Measurement - multiple wounds INTERVENTIONS - Wound Dressings []  - Small Wound Dressing one or multiple wounds 0 []  - 0 Medium Wound Dressing one or multiple wounds X- 2 20 Large Wound Dressing one or multiple wounds X- 1 5 Application of Medications - topical []  - 0 Application of Medications - injection INTERVENTIONS - Miscellaneous []  - External ear exam 0 []  - 0 Specimen Collection (cultures, biopsies, blood, body fluids, etc.) []  - 0 Specimen(s) / Culture(s) sent or taken to Lab for analysis []  - 0 Patient Transfer (multiple staff / Civil Service fast streamer / Similar devices) []  - 0 Simple Staple / Suture removal (25 or less) []  - 0 Complex Staple / Suture removal (26 or more) []  - 0 Hypo / Hyperglycemic Management (close monitor of Blood Glucose) []  - 0 Ankle / Brachial Index (ABI) - do not check if billed separately X- 1 5 Vital Signs Justin Hancock, Justin E. ([id number]) Has the patient been seen at the hospital within the last three years: Yes Total Score: 140 Level Of Care: New/Established - Level 4 Electronic Signature(s) Signed: 08/05/2017 4:36:47 PM By: Alric Quan Entered By: Alric Quan on 08/02/2017 16:20:02 Justin Hancock ([id number]) -------------------------------------------------------------------------------- Encounter Discharge Information Details Patient Name: Justin Hancock. Date of Service: 08/02/2017 2:15 PM Medical Record Number: [id number] Patient Account Number: [id number] Date of Birth/Sex: [birth year] (83 y.o.  Male) Treating RN: Ahmed Prima Primary Care Marlyn Tondreau: [place] Pink Other Clinician: Referring Agnes Brightbill: [place] Pink Treating Farid Grigorian/Extender: Tito Dine in Treatment: 1 Encounter Discharge Information Items Discharge Pain Level: 0 Discharge Condition: Stable Ambulatory Status: Walker Discharge Destination: Home Transportation: Private Auto Accompanied By: self Schedule Follow-up Appointment: Yes Medication Reconciliation completed and No provided to Patient/Care Gwenetta Devos: Provided on Clinical Summary of Care: 08/02/2017 Form Type Recipient Paper Patient RB Electronic Signature(s) Signed: 08/02/2017 3:07:25 PM By: Alric Quan Entered By: Alric Quan on 08/02/2017 15:07:25 Justin Hancock ([id number]) -------------------------------------------------------------------------------- Lower Extremity Assessment Details Patient Name: Justin Hancock. Date of Service: 08/02/2017 2:15 PM Medical Record Number: [id number] Patient Account Number: [id number] Date of Birth/Sex: [birth year] (83 y.o. Male) Treating RN: Ahmed Prima Primary Care Abbas Beyene: [place] Pink Other Clinician: Referring Merikay Lesniewski: [place] Pink Treating Vonnetta Akey/Extender: Tito Dine in Treatment: 1 Vascular Assessment Pulses: Dorsalis Pedis Palpable: [Left:No] [Right:No] Doppler Audible: [Left:Yes] [Right:Yes] Posterior Tibial Extremity colors, hair growth, and conditions: Extremity Color: [Left:Hyperpigmented] [Right:Hyperpigmented] Temperature of Extremity: [Left:Warm] [Right:Warm] Capillary Refill: [Left:< 3 seconds] [Right:< 3 seconds] Electronic Signature(s) Signed: 08/05/2017 4:36:47 PM By: Alric Quan Entered By: Alric Quan on 08/02/2017 14:37:32 Justin Hancock ([id number]) -------------------------------------------------------------------------------- Multi Wound Chart Details Patient Name: Justin Hancock. Date of Service: 08/02/2017  2:15 PM Medical Record Number: [id number] Patient Account Number: [id number] Date of Birth/Sex: [birth year] (83 y.o. Male) Treating RN: Ahmed Prima Primary Care Xerxes Agrusa: [place] Pink Other Clinician: Referring Ramla Hase: [place] Pink Treating Romney Compean/Extender: Tito Dine in Treatment: 1 Vital Signs Height(in): 73 Pulse(bpm): 60 Weight(lbs): 225.6 Blood Pressure(mmHg): 138/42 Body Mass Index(BMI): 30 Temperature(F): 97.6 Respiratory Rate 16 (breaths/min): Photos: [1:No Photos] [2:No Photos] [3:No Photos] Wound Location: [1:Right Lower Leg - Medial] [2:Left, Proximal, Medial Lower Leg] [3:Left, Distal, Medial Lower Leg] Wounding Event: [1:Gradually Appeared] [2:Gradually Appeared] [3:Gradually Appeared] Primary Etiology: [1:Diabetic Wound/Ulcer of the Lower Extremity] [2:Diabetic  Wound/Ulcer of the Lower Extremity] [3:Diabetic Wound/Ulcer of the Lower Extremity] Comorbid History: [1:Cataracts, Glaucoma, Sleep Apnea, Coronary Artery Disease, Hypertension, Peripheral Venous Disease, Type II Diabetes, Osteoarthritis] [2:N/A] [3:N/A] Date Acquired: [1:07/05/2017] [2:07/05/2017] [3:07/05/2017] Weeks of Treatment: [1:1] [2:1] [3:1] Wound Status: [1:Open] [2:Open] [3:Open] Measurements L x W x D [1:0.1x0.1x0.1] [2:0.1x0.1x0.1] [3:0.5x0.1x0.1] (cm) Area (cm) : [1:0.008] [2:0.008] [3:0.039] Volume (cm) : [1:0.001] [2:0.001] [3:0.004] % Reduction in Area: [1:100.00%] [2:99.30%] [3:98.00%] % Reduction in Volume: [1:99.90%] [2:99.20%] [3:98.00%] Classification: [1:Grade 1] [2:Grade 1] [3:Grade 1] Exudate Amount: [1:Large] [2:N/A] [3:N/A] Exudate Type: [1:Serous] [2:N/A] [3:N/A] Exudate Color: [1:amber] [2:N/A] [3:N/A] Wound Margin: [1:Distinct, outline attached] [2:N/A] [3:N/A] Granulation Amount: [1:None Present (0%)] [2:N/A] [3:N/A] Necrotic Amount: [1:Large (67-100%)] [2:N/A] [3:N/A] Necrotic Tissue: [1:Eschar, Adherent Slough] [2:N/A] [3:N/A] Exposed  Structures: [1:Fascia: No Fat Layer (Subcutaneous Tissue) Exposed: No Tendon: No Muscle: No Joint: No Bone: No] [2:N/A] [3:N/A] Epithelialization: [1:None] [2:N/A] [3:N/A] Periwound Skin Texture: No Abnormalities Noted No Abnormalities Noted No Abnormalities Noted Periwound Skin Moisture: Maceration: Yes No Abnormalities Noted No Abnormalities Noted Periwound Skin Color: No Abnormalities Noted No Abnormalities Noted No Abnormalities Noted Temperature: No Abnormality N/A N/A Tenderness on Palpation: Yes No No Wound Preparation: Ulcer Cleansing: N/A N/A Rinsed/Irrigated with Saline Topical Anesthetic Applied: Other: lidocaine 4% Treatment Notes Electronic Signature(s) Signed: 08/03/2017 8:07:20 AM By: Linton Ham MD Entered By: Linton Ham on 08/02/2017 14:51:02 Justin Hancock ([id number]) -------------------------------------------------------------------------------- [place] Details Patient Name: Justin Hancock, CALCATERRA. Date of Service: 08/02/2017 2:15 PM Medical Record Number: [id number] Patient Account Number: [id number] Date of Birth/Sex: Aug 17, 1927 (83 y.o. Male) Treating RN: Ahmed Prima Primary Care Zohan Shiflet: [place] Pink Other Clinician: Referring Oksana Deberry: [place] Pink Treating Raysha Tilmon/Extender: Tito Dine in Treatment: 1 Active Inactive ` Abuse / Safety / Falls / Self Care Management Nursing Diagnoses: Potential for falls Goals: Patient will not experience any injury related to falls Date Initiated: 07/26/2017 Target Resolution Date: 11/05/2017 Goal Status: Active Interventions: Assess fall risk on admission and as needed Notes: ` Nutrition Nursing Diagnoses: Imbalanced nutrition Impaired glucose control: actual or potential Potential for alteratiion in Nutrition/Potential for imbalanced nutrition Goals: Patient/caregiver agrees to and verbalizes understanding of need to use nutritional supplements and/or vitamins as  prescribed Date Initiated: 07/26/2017 Target Resolution Date: 10/01/2017 Goal Status: Active Patient/caregiver will maintain therapeutic glucose control Date Initiated: 07/26/2017 Target Resolution Date: 10/01/2017 Goal Status: Active Interventions: Assess patient nutrition upon admission and as needed per policy Provide education on elevated blood sugars and impact on wound healing Notes: ` Orientation to the Wound Care Program Nursing Diagnoses: Knowledge deficit related to the wound healing center program Justin Hancock, Justin Hancock ([id number]) Goals: Patient/caregiver will verbalize understanding of the [place] Date Initiated: 07/26/2017 Target Resolution Date: 08/06/2017 Goal Status: Active Interventions: Provide education on orientation to the wound center Notes: ` Wound/Skin Impairment Nursing Diagnoses: Impaired tissue integrity Knowledge deficit related to ulceration/compromised skin integrity Goals: Ulcer/skin breakdown will have a volume reduction of 80% by week 12 Date Initiated: 07/26/2017 Target Resolution Date: 11/05/2017 Goal Status: Active Interventions: Assess patient/caregiver ability to perform ulcer/skin care regimen upon admission and as needed Assess ulceration(s) every visit Notes: Electronic Signature(s) Signed: 08/05/2017 4:36:47 PM By: Alric Quan Entered By: Alric Quan on 08/02/2017 14:43:47 Justin Hancock ([id number]) -------------------------------------------------------------------------------- Pain Assessment Details Patient Name: Justin Hancock. Date of Service: 08/02/2017 2:15 PM Medical Record Number: [id number] Patient Account Number: [id number] Date of Birth/Sex: 02/09/1927 (83 y.o. Male) Treating RN: Ahmed Prima Primary Care Dynastee Brummell: [place] Pink Other Clinician:  Referring Michelangelo Rindfleisch: [place] Pink Treating [place] Scheiderer/Extender: Tito Dine in Treatment: 1 Active Problems Location of Pain  Severity and Description of Pain Patient Has Paino No Site Locations Pain Management and Medication Current Pain Management: Electronic Signature(s) Signed: 08/05/2017 4:36:47 PM By: Alric Quan Entered By: Alric Quan on 08/02/2017 14:28:35 Justin Hancock ([id number]) -------------------------------------------------------------------------------- Patient/Caregiver Education Details Patient Name: Justin Hancock. Date of Service: 08/02/2017 2:15 PM Medical Record Number: [id number] Patient Account Number: [id number] Date of Birth/Gender: 11-19-1926 (83 y.o. Male) Treating RN: Ahmed Prima Primary Care Physician: [place] Pink Other Clinician: Referring Physician: [place] Pink Treating Physician/Extender: Tito Dine in Treatment: 1 Education Assessment Education Provided To: Patient Education Topics Provided Wound/Skin Impairment: Handouts: Other: change dressing as ordered Methods: Demonstration, Explain/Verbal Responses: State content correctly Electronic Signature(s) Signed: 08/05/2017 4:36:47 PM By: Alric Quan Entered By: Alric Quan on 08/02/2017 15:07:46 Justin Hancock ([id number]) -------------------------------------------------------------------------------- Wound Assessment Details Patient Name: Justin Hancock. Date of Service: 08/02/2017 2:15 PM Medical Record Number: [id number] Patient Account Number: [id number] Date of Birth/Sex: 1927/03/30 (83 y.o. Male) Treating RN: Ahmed Prima Primary Care Renleigh Ouellet: [place] Pink Other Clinician: Referring Fortune Brannigan: [place] Pink Treating Ziggy Chanthavong/Extender: Tito Dine in Treatment: 1 Wound Status Wound Number: 1 Primary Diabetic Wound/Ulcer of the Lower Extremity Etiology: Wound Location: Right Lower Leg - Medial Wound Open Wounding Event: Gradually Appeared Status: Date Acquired: 07/05/2017 Comorbid Cataracts, Glaucoma, Sleep Apnea, Coronary Weeks Of  Treatment: 1 History: Artery Disease, Hypertension, Peripheral Clustered Wound: No Venous Disease, Type II Diabetes, Osteoarthritis Photos Photo Uploaded By: Alric Quan on 08/02/2017 16:25:51 Wound Measurements Length: (cm) 0.1 Width: (cm) 0.1 Depth: (cm) 0.1 Area: (cm) 0.008 Volume: (cm) 0.001 % Reduction in Area: 100% % Reduction in Volume: 99.9% Epithelialization: None Wound Description Classification: Grade 1 Wound Margin: Distinct, outline attached Exudate Amount: Large Exudate Type: Serous Exudate Color: amber Foul Odor After Cleansing: No Slough/Fibrino Yes Wound Bed Granulation Amount: None Present (0%) Exposed Structure Necrotic Amount: Large (67-100%) Fascia Exposed: No Necrotic Quality: Eschar, Adherent Slough Fat Layer (Subcutaneous Tissue) Exposed: No Tendon Exposed: No Muscle Exposed: No Joint Exposed: No Bone Exposed: No Periwound Skin Texture Justin Hancock, Justin E. ([id number]) Texture Color No Abnormalities Noted: No No Abnormalities Noted: No Moisture Temperature / Pain No Abnormalities Noted: No Temperature: No Abnormality Maceration: Yes Tenderness on Palpation: Yes Wound Preparation Ulcer Cleansing: Rinsed/Irrigated with Saline Topical Anesthetic Applied: Other: lidocaine 4%, Treatment Notes Wound #1 (Right, Medial Lower Leg) 1. Cleansed with: Clean wound with Normal Saline 2. Anesthetic Topical Lidocaine 4% cream to wound bed prior to debridement 5. Secondary Dressing Applied Dry Gauze 7. Secured with Tape Notes kerlix, coban, unna to Engineer, production) Signed: 08/05/2017 4:36:47 PM By: Alric Quan Entered By: Alric Quan on 08/02/2017 14:35:16 Justin Hancock ([id number]) -------------------------------------------------------------------------------- Wound Assessment Details Patient Name: Justin Hancock. Date of Service: 08/02/2017 2:15 PM Medical Record Number: [id number] Patient Account Number:  [id number] Date of Birth/Sex: [birth year] (83 y.o. Male) Treating RN: Ahmed Prima Primary Care Nyree Yonker: [place] Pink Other Clinician: Referring Tahara Ruffini: [place] Pink Treating Kenslee Achorn/Extender: Ricard Dillon Weeks in Treatment: 1 Wound Status Wound Number: 2 Primary Diabetic Wound/Ulcer of the Lower Etiology: Extremity Wound Location: Left, Proximal, Medial Lower Leg Wound Status: Open Wounding Event: Gradually Appeared Date Acquired: 07/05/2017 Weeks Of Treatment: 1 Clustered Wound: No Photos Photo Uploaded By: Alric Quan on 08/02/2017 16:30:19 Wound Measurements Length: (cm) 0.1 Width: (cm) 0.1 Depth: (cm) 0.1 Area: (cm) 0.008 Volume: (cm) 0.001 % Reduction in Area:  99.3% % Reduction in Volume: 99.2% Wound Description Classification: Grade 1 Periwound Skin Texture Texture Color No Abnormalities Noted: No No Abnormalities Noted: No Moisture No Abnormalities Noted: No Treatment Notes Wound #2 (Left, Proximal, Medial Lower Leg) 1. Cleansed with: Clean wound with Normal Saline 2. Anesthetic Topical Lidocaine 4% cream to wound bed prior to debridement 5. Secondary Dressing Applied Justin Hancock, Justin Hancock ([id number]) Dry Gauze 7. Secured with Tape Notes kerlix, coban, unna to Engineer, production) Signed: 08/05/2017 4:36:47 PM By: Alric Quan Entered By: Alric Quan on 08/02/2017 14:35:08 Justin Hancock ([id number]) -------------------------------------------------------------------------------- Wound Assessment Details Patient Name: Justin Hancock. Date of Service: 08/02/2017 2:15 PM Medical Record Number: [id number] Patient Account Number: [id number] Date of Birth/Sex: [birth year] (83 y.o. Male) Treating RN: Ahmed Prima Primary Care Ashlynne Shetterly: [place] Pink Other Clinician: Referring Jessen Siegman: [place] Pink Treating Zari Cly/Extender: Ricard Dillon Weeks in Treatment: 1 Wound Status Wound Number: 3 Primary Diabetic  Wound/Ulcer of the Lower Etiology: Extremity Wound Location: Left, Distal, Medial Lower Leg Wound Status: Open Wounding Event: Gradually Appeared Date Acquired: 07/05/2017 Weeks Of Treatment: 1 Clustered Wound: No Photos Photo Uploaded By: Alric Quan on 08/02/2017 16:30:20 Wound Measurements Length: (cm) 0.5 Width: (cm) 0.1 Depth: (cm) 0.1 Area: (cm) 0.039 Volume: (cm) 0.004 % Reduction in Area: 98% % Reduction in Volume: 98% Wound Description Classification: Grade 1 Periwound Skin Texture Texture Color No Abnormalities Noted: No No Abnormalities Noted: No Moisture No Abnormalities Noted: No Treatment Notes Wound #3 (Left, Distal, Medial Lower Leg) 1. Cleansed with: Clean wound with Normal Saline 2. Anesthetic Topical Lidocaine 4% cream to wound bed prior to debridement 5. Secondary Dressing Applied Justin Hancock, Justin Hancock ([id number]) Dry Gauze 7. Secured with Tape Notes kerlix, coban, unna to Engineer, production) Signed: 08/05/2017 4:36:47 PM By: Alric Quan Entered By: Alric Quan on 08/02/2017 14:35:08 Justin Hancock ([id number]) -------------------------------------------------------------------------------- [place] Details Patient Name: Justin Hancock. Date of Service: 08/02/2017 2:15 PM Medical Record Number: [id number] Patient Account Number: [id number] Date of Birth/Sex: 03-Aug-1927 (83 y.o. Male) Treating RN: Ahmed Prima Primary Care Chinedu Agustin: [place] Pink Other Clinician: Referring Luster Hechler: [place] Pink Treating Arvetta Araque/Extender: Tito Dine in Treatment: 1 Vital Signs Time Taken: 14:28 Temperature (F): 97.6 Height (in): 73 Pulse (bpm): 60 Weight (lbs): 225.6 Respiratory Rate (breaths/min): 16 Body Mass Index (BMI): 29.8 Blood Pressure (mmHg): 138/42 Reference Range: 80 - 120 mg / dl Electronic Signature(s) Signed: 08/05/2017 4:36:47 PM By: Alric Quan Entered By: Alric Quan on 08/02/2017  14:28:55

## 2017-08-09 ENCOUNTER — Encounter: Payer: PPO | Admitting: Internal Medicine

## 2017-08-09 DIAGNOSIS — E11622 Type 2 diabetes mellitus with other skin ulcer: Secondary | ICD-10-CM | POA: Diagnosis not present

## 2017-08-09 DIAGNOSIS — I87333 Chronic venous hypertension (idiopathic) with ulcer and inflammation of bilateral lower extremity: Secondary | ICD-10-CM | POA: Diagnosis not present

## 2017-08-09 DIAGNOSIS — L97222 Non-pressure chronic ulcer of left calf with fat layer exposed: Secondary | ICD-10-CM | POA: Diagnosis not present

## 2017-08-11 NOTE — Progress Notes (Signed)
SUMMIT, ARROYAVE ([id number]) Visit Report for 08/09/2017 Debridement Details Patient Name: Justin Hancock, Justin Hancock. Date of Service: 08/09/2017 8:45 AM Medical Record Number: [id number] Patient Account Number: [id number] Date of Birth/Sex: June 10, 1927 (83 y.o. Male) Treating RN: Justin Hancock Primary Care Provider: Maryland Hancock Other Clinician: Referring Provider: Maryland Hancock Treating Provider/Extender: Justin Hancock in Treatment: 2 Debridement Performed for Wound #3 Left,Distal,Medial Lower Leg Assessment: Performed By: Physician Justin Dillon, MD Debridement: Debridement Severity of Tissue Pre Fat layer exposed Debridement: Pre-procedure Verification/Time Yes - 09:16 Out Taken: Start Time: 09:17 Pain Control: Lidocaine 4% Topical Solution Level: Skin/Subcutaneous Tissue Total Area Debrided (L x W): 0.2 (cm) x 0.2 (cm) = 0.04 (cm) Tissue and other material Viable, Non-Viable, Exudate, Fibrin/Slough, Subcutaneous debrided: Instrument: Curette Bleeding: Minimum Hemostasis Achieved: Pressure End Time: 09:18 Procedural Pain: 0 Post Procedural Pain: 0 Response to Treatment: Procedure was tolerated well Post Debridement Measurements of Total Wound Length: (cm) 0.4 Width: (cm) 0.4 Depth: (cm) 0.1 Volume: (cm) 0.013 Character of Wound/Ulcer Post Debridement: Requires Further Debridement Severity of Tissue Post Debridement: Fat layer exposed Post Procedure Diagnosis Same as Pre-procedure Electronic Signature(s) Signed: 08/10/2017 4:36:42 PM By: Justin Ham MD Signed: 08/10/2017 4:49:34 PM By: Justin Hancock Entered By: Justin Hancock on 08/09/2017 09:27:04 Justin Hancock ([id number]) -------------------------------------------------------------------------------- HPI Details Patient Name: Justin Hancock. Date of Service: 08/09/2017 8:45 AM Medical Record Number: [id number] Patient Account Number: [id number] Date of Birth/Sex: 1927-08-19 (83 y.o.  Male) Treating RN: Justin Hancock Primary Care Provider: Maryland Hancock Other Clinician: Referring Provider: Maryland Hancock Treating Provider/Extender: Justin Hancock in Treatment: 2 History of Present Illness HPI Description: 07/26/17; [name] is a gentleman who follows with Dr. Leotis Pain vein and vascular. He has known atherosclerotic disease with among other things a left renal artery stent. He takes Plavix. He is a type II diabetic on metformin. He also has multiple coronary artery stents and known peripheral vascular disease. The patient states that he is followed by Dr. dew with apparent serial arterial studies in his legs he thinks the last one was done a year ago. He was a remote smoker quitting many years ago. The patient tells me that roughly 2-3 weeks ago he developed raised blisters o2 in his left leg and o2 in his right leg.Marland Kitchen He states that these happened at night and there is no known trauma. The edema in his legs was not clearly much larger than it is now. He does not describe claudication. He is been using Neosporin and Band-Aids to the wounds. ABIs in our clinics were 0.58 bilaterally 08/02/17; patient saw Dr. dew this morning and according to the patient is going for reflux studies on Friday. His wounds on his bilateral lower legs are mostly closed. We use Kerlix Coban compression. His edema is well controlled. He tells me he does have stockings at home which she will bring next week and actually reapply them after the studies he has had vein and vascular on Friday. In any case he should be healed next week 08/09/17; the patient had his reflux studies and saw Dr. dew although I can't pull up the actual studies although I can see they've been done in Epic. He tells me is going to have a procedure on the right leg but not the left. He still has 1 nonhealing area on the medial left calf. He brought his compression stockings although I'm not exactly sure what  compression these are. He is traveling to [place] for 2 weeks  leaving this week for the Thanksgiving holiday Electronic Signature(s) Signed: 08/10/2017 4:36:42 PM By: Justin Ham MD Entered By: Justin Hancock on 08/09/2017 09:28:49 Justin Hancock ([id number]) -------------------------------------------------------------------------------- Physical Exam Details Patient Name: Justin Hancock, Justin Hancock. Date of Service: 08/09/2017 8:45 AM Medical Record Number: [id number] Patient Account Number: [id number] Date of Birth/Sex: 04/16/1927 (83 y.o. Male) Treating RN: Justin Hancock Primary Care Provider: Maryland Hancock Other Clinician: Referring Provider: Maryland Hancock Treating Provider/Extender: Justin Hancock Weeks in Treatment: 2 Notes Wound exam; the patient now has only one open area on the medial left leg. Necrotic debris around the circumference which was removed with an open curet hemostasis with direct pressure. This should heal. There is no open area on the right leg. Both legs have well controlled edema Electronic Signature(s) Signed: 08/10/2017 4:36:42 PM By: Justin Ham MD Entered By: Justin Hancock on 08/09/2017 09:29:50 Justin Hancock ([id number]) -------------------------------------------------------------------------------- Physician Orders Details Patient Name: Justin Hancock. Date of Service: 08/09/2017 8:45 AM Medical Record Number: [id number] Patient Account Number: [id number] Date of Birth/Sex: [birth year] (83 y.o. Male) Treating RN: Justin Hancock Primary Care Provider: Maryland Hancock Other Clinician: Referring Provider: Maryland Hancock Treating Provider/Extender: Justin Hancock in Treatment: 2 Verbal / Phone Orders: Yes Clinician: Carolyne Hancock, Justin Hancock Read Back and Verified: Yes Diagnosis Coding Wound Cleansing Wound #3 Left,Distal,Medial Lower Leg o Clean wound with Normal Saline. o Cleanse wound with mild soap and water Anesthetic Wound #3  Left,Distal,Medial Lower Leg o Topical Lidocaine 4% cream applied to wound bed prior to debridement Skin Barriers/Peri-Wound Care Wound #3 Left,Distal,Medial Lower Leg o Skin Prep Primary Wound Dressing Wound #3 Left,Distal,Medial Lower Leg o Silvercel Non-Adherent Secondary Dressing Wound #3 Left,Distal,Medial Lower Leg o Dry Gauze o Boardered Foam Dressing Dressing Change Frequency Wound #3 Left,Distal,Medial Lower Leg o Change dressing every day. Follow-up Appointments Wound #3 Left,Distal,Medial Lower Leg o Return Appointment in 2 weeks. Edema Control Wound #3 Left,Distal,Medial Lower Leg o Elevate legs to the level of the heart and pump ankles as often as possible Additional Orders / Instructions Wound #3 Left,Distal,Medial Lower Leg o Increase protein intake. Electronic Signature(s) Justin Hancock, Justin Hancock ([id number]) Signed: 08/10/2017 4:36:42 PM By: Justin Ham MD Signed: 08/10/2017 4:49:34 PM By: Justin Hancock Entered By: Justin Hancock on 08/09/2017 09:19:40 Justin Hancock ([id number]) -------------------------------------------------------------------------------- Problem List Details Patient Name: JAHMEIR, GEISEN. Date of Service: 08/09/2017 8:45 AM Medical Record Number: [id number] Patient Account Number: [id number] Date of Birth/Sex: 12-17-1926 (83 y.o. Male) Treating RN: Justin Hancock Primary Care Provider: Maryland Hancock Other Clinician: Referring Provider: Maryland Hancock Treating Provider/Extender: Justin Hancock in Treatment: 2 Active Problems ICD-10 Encounter Code Description Active Date Diagnosis L97.221 Non-pressure chronic ulcer of left calf limited to breakdown of skin 07/26/2017 Yes L97.211 Non-pressure chronic ulcer of right calf limited to breakdown of skin 07/26/2017 Yes I87.333 Chronic venous hypertension (idiopathic) with ulcer and 07/26/2017 Yes inflammation of bilateral lower extremity E11.51 Type 2  diabetes mellitus with diabetic peripheral angiopathy without 07/26/2017 Yes gangrene Inactive Problems Resolved Problems Electronic Signature(s) Signed: 08/10/2017 4:36:42 PM By: Justin Ham MD Entered By: Justin Hancock on 08/09/2017 09:26:03 Justin Hancock ([id number]) -------------------------------------------------------------------------------- Progress Note Details Patient Name: Justin Hancock. Date of Service: 08/09/2017 8:45 AM Medical Record Number: [id number] Patient Account Number: [id number] Date of Birth/Sex: 1927/09/10 (83 y.o. Male) Treating RN: Justin Hancock Primary Care Provider: Maryland Hancock Other Clinician: Referring Provider: Maryland Hancock Treating Provider/Extender: Justin Hancock Weeks in Treatment: 2 Subjective History of Present Illness (HPI) 07/26/17; Mr. Shanks  is a gentleman who follows with Dr. Leotis Pain vein and vascular. He has known atherosclerotic disease with among other things a left renal artery stent. He takes Plavix. He is a type II diabetic on metformin. He also has multiple coronary artery stents and known peripheral vascular disease. The patient states that he is followed by Dr. dew with apparent serial arterial studies in his legs he thinks the last one was done a year ago. He was a remote smoker quitting many years ago. The patient tells me that roughly 2-3 weeks ago he developed raised blisters o2 in his left leg and o2 in his right leg.Marland Kitchen He states that these happened at night and there is no known trauma. The edema in his legs was not clearly much larger than it is now. He does not describe claudication. He is been using Neosporin and Band-Aids to the wounds. ABIs in our clinics were 0.58 bilaterally 08/02/17; patient saw Dr. dew this morning and according to the patient is going for reflux studies on Friday. His wounds on his bilateral lower legs are mostly closed. We use Kerlix Coban compression. His edema is well  controlled. He tells me he does have stockings at home which she will bring next week and actually reapply them after the studies he has had vein and vascular on Friday. In any case he should be healed next week 08/09/17; the patient had his reflux studies and saw Dr. dew although I can't pull up the actual studies although I can see they've been done in Epic. He tells me is going to have a procedure on the right leg but not the left. He still has 1 nonhealing area on the medial left calf. He brought his compression stockings although I'm not exactly sure what compression these are. He is traveling to [place] for 2 weeks leaving this week for the Thanksgiving holiday Objective Constitutional Vitals Time Taken: 9:03 AM, Height: 73 in, Weight: 225.6 lbs, BMI: 29.8, Temperature: 97.6 F, Pulse: 75 bpm, Respiratory Rate: 16 breaths/min, Blood Pressure: 144/70 mmHg. Integumentary (Hair, Skin) Wound #1 status is Healed - Epithelialized. Original cause of wound was Gradually Appeared. The wound is located on the Right,Medial Lower Leg. The wound measures 0cm length x 0cm width x 0cm depth; 0cm^2 area and 0cm^3 volume. Wound #2 status is Healed - Epithelialized. Original cause of wound was Gradually Appeared. The wound is located on the Left,Proximal,Medial Lower Leg. The wound measures 0cm length x 0cm width x 0cm depth; 0cm^2 area and 0cm^3 volume. Wound #3 status is Open. Original cause of wound was Gradually Appeared. The wound is located on the Left,Distal,Medial Lower Leg. The wound measures 0.2cm length x 0.2cm width x 0.1cm depth; 0.031cm^2 area and 0.003cm^3 volume. There is no tunneling or undermining noted. There is a medium amount of serosanguineous drainage noted. The wound margin is Pointer, Merrick ([id number]) distinct with the outline attached to the wound base. There is no granulation within the wound bed. There is a large (67-100%) amount of necrotic tissue within the wound bed  including Adherent Slough. Periwound temperature was noted as No Abnormality. The periwound has tenderness on palpation. Assessment Active Problems ICD-10 L97.221 - Non-pressure chronic ulcer of left calf limited to breakdown of skin L97.211 - Non-pressure chronic ulcer of right calf limited to breakdown of skin I87.333 - Chronic venous hypertension (idiopathic) with ulcer and inflammation of bilateral lower extremity E11.51 - Type 2 diabetes mellitus with diabetic peripheral angiopathy without gangrene Procedures Wound #3  Pre-procedure diagnosis of Wound #3 is a Diabetic Wound/Ulcer of the Lower Extremity located on the Left,Distal,Medial Lower Leg .Severity of Tissue Pre Debridement is: Fat layer exposed. There was a Skin/Subcutaneous Tissue Debridement ([id number]) debridement with total area of 0.04 sq cm performed by Justin Dillon, MD. with the following instrument(s): Curette to remove Viable and Non-Viable tissue/material including Exudate, Fibrin/Slough, and Subcutaneous after achieving pain control using Lidocaine 4% Topical Solution. A time out was conducted at 09:16, prior to the start of the procedure. A Minimum amount of bleeding was controlled with Pressure. The procedure was tolerated well with a pain level of 0 throughout and a pain level of 0 following the procedure. Post Debridement Measurements: 0.4cm length x 0.4cm width x 0.1cm depth; 0.013cm^3 volume. Character of Wound/Ulcer Post Debridement requires further debridement. Severity of Tissue Post Debridement is: Fat layer exposed. Post procedure Diagnosis Wound #3: Same as Pre-Procedure Plan Wound Cleansing: Wound #3 Left,Distal,Medial Lower Leg: Clean wound with Normal Saline. Cleanse wound with mild soap and water Anesthetic: Wound #3 Left,Distal,Medial Lower Leg: Topical Lidocaine 4% cream applied to wound bed prior to debridement Skin Barriers/Peri-Wound Care: Wound #3 Left,Distal,Medial Lower Leg: Skin  Prep Primary Wound Dressing: Wound #3 Left,Distal,Medial Lower Leg: Silvercel Non-Adherent Secondary Dressing: Justin Hancock, Justin Hancock. ([id number]) Wound #3 Left,Distal,Medial Lower Leg: Dry Gauze Boardered Foam Dressing Dressing Change Frequency: Wound #3 Left,Distal,Medial Lower Leg: Change dressing every day. Follow-up Appointments: Wound #3 Left,Distal,Medial Lower Leg: Return Appointment in 2 weeks. Edema Control: Wound #3 Left,Distal,Medial Lower Leg: Elevate legs to the level of the heart and pump ankles as often as possible Additional Orders / Instructions: Wound #3 Left,Distal,Medial Lower Leg: Increase protein intake. -contnue with silver alginate and border foam dressing -patient can use his own stockings -patient travelling to [place] for 2 weeks,not an ideal situation for transitioning to his own stockings which are of unknown compression -change dressing q2h, see back in early dec if not healed Electronic Signature(s) Signed: 08/10/2017 4:36:42 PM By: Justin Ham MD Entered By: Justin Hancock on 08/09/2017 09:32:14 Justin Hancock ([id number]) -------------------------------------------------------------------------------- [place] Details Patient Name: Justin Hancock. Date of Service: 08/09/2017 Medical Record Number: [id number] Patient Account Number: [id number] Date of Birth/Sex: 09/16/1927 (83 y.o. Male) Treating RN: Justin Hancock Primary Care Provider: Maryland Hancock Other Clinician: Referring Provider: Maryland Hancock Treating Provider/Extender: Justin Hancock in Treatment: 2 Diagnosis Coding ICD-10 Codes Code Description [phone] Non-pressure chronic ulcer of left calf limited to breakdown of skin L97.211 Non-pressure chronic ulcer of right calf limited to breakdown of skin I87.333 Chronic venous hypertension (idiopathic) with ulcer and inflammation of bilateral lower extremity E11.51 Type 2 diabetes mellitus with diabetic peripheral  angiopathy without gangrene Facility Procedures CPT4 Code: [id number] Description: [place] - DEB SUBQ TISSUE 20 SQ CM/< ICD-10 Diagnosis Description L97.221 Non-pressure chronic ulcer of left calf limited to breakdown Modifier: of skin Quantity: 1 Physician Procedures CPT4 Code: [id number] Description: 11042 - WC PHYS SUBQ TISS 20 SQ CM ICD-10 Diagnosis Description L97.221 Non-pressure chronic ulcer of left calf limited to breakdown Modifier: of skin Quantity: 1 Electronic Signature(s) Signed: 08/10/2017 4:36:42 PM By: Justin Ham MD Entered By: Justin Hancock on 08/09/2017 09:32:40

## 2017-08-11 NOTE — Progress Notes (Addendum)
Justin, Hancock ([id number]) Visit Report for 08/09/2017 Arrival Information Details Patient Name: Justin Hancock, Justin Hancock. Date of Service: 08/09/2017 8:45 AM Medical Record Number: [id number] Patient Account Number: [id number] Date of Birth/Sex: December 12, 1926 (83 y.o. Male) Treating RN: Ahmed Prima Primary Care Denham Mose: [place] Pink Other Clinician: Referring Cj Edgell: [place] Pink Treating Antonette Hendricks/Extender: Tito Dine in Treatment: 2 Visit Information History Since Last Visit All ordered tests and consults were completed: No Patient Arrived: Gilford Rile Added or deleted any medications: No Arrival Time: 08:50 Any new allergies or adverse reactions: No Accompanied By: self Had a fall or experienced change in No Transfer Assistance: EasyPivot Patient activities of daily living that may affect Lift risk of falls: Patient Identification Verified: Yes Signs or symptoms of abuse/neglect since last visito No Secondary Verification Process Yes Hospitalized since last visit: No Completed: Has Dressing in Place as Prescribed: Yes Patient Requires Transmission-Based No Precautions: Has Compression in Place as Prescribed: Yes Patient Has Alerts: Yes Pain Present Now: No Patient Alerts: Patient on Blood Thinner DM II Plavix Electronic Signature(s) Signed: 08/10/2017 4:49:34 PM By: Alric Quan Entered By: Alric Quan on 08/09/2017 09:03:10 Justin Hancock ([id number]) -------------------------------------------------------------------------------- Encounter Discharge Information Details Patient Name: Justin Hancock. Date of Service: 08/09/2017 8:45 AM Medical Record Number: [id number] Patient Account Number: [id number] Date of Birth/Sex: March 12, 1927 (83 y.o. Male) Treating RN: Ahmed Prima Primary Care Annaclaire Walsworth: [place] Pink Other Clinician: Referring Juneau Doughman: [place] Pink Treating Chipper Koudelka/Extender: Tito Dine in Treatment: 2 Encounter  Discharge Information Items Discharge Pain Level: 0 Discharge Condition: Stable Ambulatory Status: Walker Discharge Destination: Home Transportation: Private Auto Accompanied By: self Schedule Follow-up Appointment: Yes Medication Reconciliation completed and No provided to Patient/Care Gavyn Zoss: Provided on Clinical Summary of Care: 08/09/2017 Form Type Recipient Paper Patient RB Electronic Signature(s) Signed: 08/10/2017 11:35:39 AM By: Ruthine Dose Entered By: Ruthine Dose on 08/09/2017 [place], [place] ([id number]) -------------------------------------------------------------------------------- Lower Extremity Assessment Details Patient Name: Justin Hancock. Date of Service: 08/09/2017 8:45 AM Medical Record Number: [id number] Patient Account Number: [id number] Date of Birth/Sex: 08/04/1927 (83 y.o. Male) Treating RN: Ahmed Prima Primary Care Jamee Pacholski: [place] Pink Other Clinician: Referring Journei Thomassen: [place] Pink Treating Abad Manard/Extender: Tito Dine in Treatment: 2 Vascular Assessment Pulses: Dorsalis Pedis Palpable: [Left:No] [Right:No] Doppler Audible: [Left:Yes] [Right:Yes] Posterior Tibial Extremity colors, hair growth, and conditions: Extremity Color: [Left:Hyperpigmented] [Right:Hyperpigmented] Temperature of Extremity: [Left:Warm] [Right:Warm] Capillary Refill: [Left:< 3 seconds] [Right:< 3 seconds] Toe Nail Assessment Left: Right: Thick: Yes Yes Discolored: Yes Yes Deformed: No No Improper Length and Hygiene: No No Electronic Signature(s) Signed: 08/10/2017 4:49:34 PM By: Alric Quan Entered By: Alric Quan on 08/09/2017 09:13:21 Justin Hancock ([id number]) -------------------------------------------------------------------------------- Multi Wound Chart Details Patient Name: Justin Hancock. Date of Service: 08/09/2017 8:45 AM Medical Record Number: [id number] Patient Account Number: [id number] Date of  Birth/Sex: [birth year] (83 y.o. Male) Treating RN: Ahmed Prima Primary Care Casson Catena: [place] Pink Other Clinician: Referring Tekelia Kareem: [place] Treating Mkayla Steele/Extender: Tito Dine in Treatment: 2 Vital Signs Height(in): 73 Pulse(bpm): 75 Weight(lbs): 225.6 Blood Pressure(mmHg): 144/70 Body Mass Index(BMI): 30 Temperature(F): 97.6 Respiratory Rate 16 (breaths/min): Photos: [1:No Photos] [2:No Photos] [3:No Photos] Wound Location: [1:Right, Medial Lower Leg] [2:Left, Proximal, Medial Lower Leg] [3:Left Lower Leg - Medial, Distal] Wounding Event: [1:Gradually Appeared] [2:Gradually Appeared] [3:Gradually Appeared] Primary Etiology: [1:Diabetic Wound/Ulcer of the Lower Extremity] [2:Diabetic Wound/Ulcer of the Lower Extremity] [3:Diabetic Wound/Ulcer of the Lower Extremity] Comorbid History: [1:N/A] [2:N/A] [3:Cataracts, Glaucoma, Sleep Apnea, Coronary Artery Disease, Hypertension, Peripheral Venous Disease,  Type II Diabetes, Osteoarthritis] Date Acquired: [1:07/05/2017] [2:07/05/2017] [3:07/05/2017] Weeks of Treatment: [1:2] [2:2] [3:2] Wound Status: [1:Healed - Epithelialized] [2:Healed - Epithelialized] [3:Open] Measurements L x W x D [1:0x0x0] [2:0x0x0] [3:0.2x0.2x0.1] (cm) Area (cm) : [1:0] [2:0] [3:0.031] Volume (cm) : [1:0] [2:0] [3:0.003] % Reduction in Area: [1:100.00%] [2:100.00%] [3:98.40%] % Reduction in Volume: [1:100.00%] [2:100.00%] [3:98.50%] Classification: [1:Grade 1] [2:Grade 1] [3:Grade 1] Exudate Amount: [1:N/A] [2:N/A] [3:Medium] Exudate Type: [1:N/A] [2:N/A] [3:Serosanguineous] Exudate Color: [1:N/A] [2:N/A] [3:red, brown] Wound Margin: [1:N/A] [2:N/A] [3:Distinct, outline attached] Granulation Amount: [1:N/A] [2:N/A] [3:None Present (0%)] Necrotic Amount: [1:N/A] [2:N/A] [3:Large (67-100%)] Epithelialization: [1:N/A] [2:N/A] [3:None] Debridement: [1:N/A] [2:N/A] [3:Debridement ([id number])] Pre-procedure [1:N/A] [2:N/A]  [3:09:16] Verification/Time Out Taken: Pain Control: [1:N/A] [2:N/A] [3:Lidocaine 4% Topical Solution] Tissue Debrided: [1:N/A] [2:N/A] [3:Fibrin/Slough, Exudates, Subcutaneous] Level: [1:N/A] [2:N/A] [3:Skin/Subcutaneous Tissue] Debridement Area (sq cm): [1:N/A] [2:N/A] [3:0.04] Instrument: N/A N/A Curette Bleeding: N/A N/A Minimum Hemostasis Achieved: N/A N/A Pressure Procedural Pain: N/A N/A 0 Post Procedural Pain: N/A N/A 0 Debridement Treatment N/A N/A Procedure was tolerated well Response: Post Debridement N/A N/A 0.4x0.4x0.1 Measurements L x W x D (cm) Post Debridement Volume: N/A N/A 0.013 (cm) Periwound Skin Texture: No Abnormalities Noted No Abnormalities Noted No Abnormalities Noted Periwound Skin Moisture: No Abnormalities Noted No Abnormalities Noted No Abnormalities Noted Periwound Skin Color: No Abnormalities Noted No Abnormalities Noted No Abnormalities Noted Temperature: N/A N/A No Abnormality Tenderness on Palpation: No No Yes Wound Preparation: N/A N/A Ulcer Cleansing: Rinsed/Irrigated with Saline, Other: soap and water Topical Anesthetic Applied: Other: lidocaine 4% Procedures Performed: N/A N/A Debridement Treatment Notes Wound #3 (Left, Distal, Medial Lower Leg) 1. Cleansed with: Clean wound with Normal Saline Cleanse wound with antibacterial soap and water 2. Anesthetic Topical Lidocaine 4% cream to wound bed prior to debridement 3. Peri-wound Care: Skin Prep 4. Dressing Applied: Other dressing (specify in notes) 5. Secondary Dressing Applied Bordered Foam Dressing Notes silvercel Electronic Signature(s) Signed: 08/10/2017 4:36:42 PM By: Linton Ham MD Entered By: Linton Ham on 08/09/2017 09:26:51 Justin Hancock ([id number]) -------------------------------------------------------------------------------- Multi-Disciplinary Care Plan Details Patient Name: ADISON, JERGER. Date of Service: 08/09/2017 8:45 AM Medical Record  Number: [id number] Patient Account Number: [id number] Date of Birth/Sex: [birth year] (83 y.o. Male) Treating RN: Ahmed Prima Primary Care Ajanae Virag: [place] Pink Other Clinician: Referring Jacobb Alen: [place] Pink Treating Carynn Felling/Extender: Tito Dine in Treatment: 2 Active Inactive Electronic Signature(s) Signed: 09/15/2017 4:21:26 PM By: Gretta Cool, BSN, RN, CWS, Kim RN, BSN Signed: 10/04/2017 2:58:53 PM By: Alric Quan Previous Signature: 08/10/2017 4:49:34 PM Version By: Alric Quan Entered By: Gretta Cool BSN, RN, CWS, Kim on 09/15/2017 16:21:26 ZACHERY, NISWANDER ([id number]) -------------------------------------------------------------------------------- Pain Assessment Details Patient Name: MARSHELL, DILAURO. Date of Service: 08/09/2017 8:45 AM Medical Record Number: [id number] Patient Account Number: [id number] Date of Birth/Sex: [birth year] (83 y.o. Male) Treating RN: Ahmed Prima Primary Care Kairy Folsom: [place] Pink Other Clinician: Referring Marvelous Woolford: [place] Pink Treating Sejla Marzano/Extender: Ricard Dillon Weeks in Treatment: 2 Active Problems Location of Pain Severity and Description of Pain Patient Has Paino No Site Locations Pain Management and Medication Current Pain Management: Electronic Signature(s) Signed: 08/10/2017 4:49:34 PM By: Alric Quan Entered By: Alric Quan on 08/09/2017 09:03:19 Justin Hancock ([id number]) -------------------------------------------------------------------------------- Patient/Caregiver Education Details Patient Name: Justin Hancock Date of Service: 08/09/2017 8:45 AM Medical Record Number: [id number] Patient Account Number: [id number] Date of Birth/Gender: [birth year] (83 y.o. Male) Treating RN: Ahmed Prima Primary Care Physician: [place] Pink Other Clinician: Referring Physician: [place] Pink Treating Physician/Extender: Ricard Dillon Weeks in  Treatment: 2 Education  Assessment Education Provided To: Patient Education Topics Provided Wound/Skin Impairment: Handouts: Other: change dressing as ordered Methods: Demonstration, Explain/Verbal Responses: State content correctly Electronic Signature(s) Signed: 08/10/2017 4:49:34 PM By: Alric Quan Entered By: Alric Quan on 08/09/2017 09:21:44 Justin Hancock ([id number]) -------------------------------------------------------------------------------- Wound Assessment Details Patient Name: Justin Hancock. Date of Service: 08/09/2017 8:45 AM Medical Record Number: [id number] Patient Account Number: [id number] Date of Birth/Sex: [birth year] (83 y.o. Male) Treating RN: Ahmed Prima Primary Care Eliah Ozawa: [place] Pink Other Clinician: Referring Caran Storck: [place] Pink Treating Ashlynn Gunnels/Extender: Ricard Dillon Weeks in Treatment: 2 Wound Status Wound Number: 1 Primary Diabetic Wound/Ulcer of the Lower Etiology: Extremity Wound Location: Right, Medial Lower Leg Wound Status: Healed - Epithelialized Wounding Event: Gradually Appeared Date Acquired: 07/05/2017 Weeks Of Treatment: 2 Clustered Wound: No Photos Photo Uploaded By: Alric Quan on 08/10/2017 11:11:43 Wound Measurements Length: (cm) 0 % Width: (cm) 0 % Depth: (cm) 0 Area: (cm) 0 Volume: (cm) 0 Reduction in Area: 100% Reduction in Volume: 100% Wound Description Classification: Grade 1 Periwound Skin Texture Texture Color No Abnormalities Noted: No No Abnormalities Noted: No Moisture No Abnormalities Noted: No Electronic Signature(s) Signed: 08/10/2017 4:49:34 PM By: Alric Quan Entered By: Alric Quan on 08/09/2017 09:12:42 Justin Hancock ([id number]) -------------------------------------------------------------------------------- Wound Assessment Details Patient Name: Justin Hancock. Date of Service: 08/09/2017 8:45 AM Medical Record Number: [id number] Patient Account Number:  [id number] Date of Birth/Sex: June 01, 1927 (83 y.o. Male) Treating RN: Ahmed Prima Primary Care Sanav Remer: [place] Pink Other Clinician: Referring Kosta Schnitzler: [place] Pink Treating Samariya Rockhold/Extender: Ricard Dillon Weeks in Treatment: 2 Wound Status Wound Number: 2 Primary Diabetic Wound/Ulcer of the Lower Etiology: Extremity Wound Location: Left, Proximal, Medial Lower Leg Wound Status: Healed - Epithelialized Wounding Event: Gradually Appeared Date Acquired: 07/05/2017 Weeks Of Treatment: 2 Clustered Wound: No Photos Photo Uploaded By: Alric Quan on 08/10/2017 11:11:46 Wound Measurements Length: (cm) 0 % Width: (cm) 0 % Depth: (cm) 0 Area: (cm) 0 Volume: (cm) 0 Reduction in Area: 100% Reduction in Volume: 100% Wound Description Classification: Grade 1 Periwound Skin Texture Texture Color No Abnormalities Noted: No No Abnormalities Noted: No Moisture No Abnormalities Noted: No Electronic Signature(s) Signed: 08/10/2017 4:49:34 PM By: Alric Quan Entered By: Alric Quan on 08/09/2017 09:12:50 Justin Hancock ([id number]) -------------------------------------------------------------------------------- Wound Assessment Details Patient Name: Justin Hancock. Date of Service: 08/09/2017 8:45 AM Medical Record Number: [id number] Patient Account Number: [id number] Date of Birth/Sex: 12-21-1926 (83 y.o. Male) Treating RN: Ahmed Prima Primary Care Raphaella Larkin: [place] Pink Other Clinician: Referring Matricia Begnaud: [place] Pink Treating Florentine Diekman/Extender: Ricard Dillon Weeks in Treatment: 2 Wound Status Wound Number: 3 Primary Diabetic Wound/Ulcer of the Lower Extremity Etiology: Wound Location: Left Lower Leg - Medial, Distal Wound Open Wounding Event: Gradually Appeared Status: Date Acquired: 07/05/2017 Comorbid Cataracts, Glaucoma, Sleep Apnea, Coronary Weeks Of Treatment: 2 History: Artery Disease, Hypertension, Peripheral Clustered  Wound: No Venous Disease, Type II Diabetes, Osteoarthritis Photos Photo Uploaded By: Alric Quan on 08/10/2017 11:12:16 Wound Measurements Length: (cm) 0.2 Width: (cm) 0.2 Depth: (cm) 0.1 Area: (cm) 0.031 Volume: (cm) 0.003 % Reduction in Area: 98.4% % Reduction in Volume: 98.5% Epithelialization: None Tunneling: No Undermining: No Wound Description Classification: Grade 1 Wound Margin: Distinct, outline attached Exudate Amount: Medium Exudate Type: Serosanguineous Exudate Color: red, brown Foul Odor After Cleansing: No Slough/Fibrino Yes Wound Bed Granulation Amount: None Present (0%) Necrotic Amount: Large (67-100%) Necrotic Quality: Adherent Slough Periwound Skin Texture Texture Color No Abnormalities Noted: No No Abnormalities Noted: No Moisture Temperature /  Pain No Abnormalities Noted: No Temperature: No Abnormality Ridlon, Toriano E. ([id number]) Tenderness on Palpation: Yes Wound Preparation Ulcer Cleansing: Rinsed/Irrigated with Saline, Other: soap and water, Topical Anesthetic Applied: Other: lidocaine 4%, Electronic Signature(s) Signed: 08/10/2017 4:49:34 PM By: Alric Quan Entered By: Alric Quan on 08/09/2017 09:17:13 Justin Hancock ([id number]) -------------------------------------------------------------------------------- [place] Details Patient Name: Justin Hancock. Date of Service: 08/09/2017 8:45 AM Medical Record Number: [id number] Patient Account Number: [id number] Date of Birth/Sex: 1926-10-04 (83 y.o. Male) Treating RN: Ahmed Prima Primary Care Sharrieff Spratlin: [place] Pink Other Clinician: Referring Taariq Leitz: [place] Pink Treating Keiri Solano/Extender: Tito Dine in Treatment: 2 Vital Signs Time Taken: 09:03 Temperature (F): 97.6 Height (in): 73 Pulse (bpm): 75 Weight (lbs): 225.6 Respiratory Rate (breaths/min): 16 Body Mass Index (BMI): 29.8 Blood Pressure (mmHg): 144/70 Reference Range: 80 - 120  mg / dl Electronic Signature(s) Signed: 08/10/2017 4:49:34 PM By: Alric Quan Entered By: Alric Quan on 08/09/2017 09:05:23

## 2017-08-26 DIAGNOSIS — E1140 Type 2 diabetes mellitus with diabetic neuropathy, unspecified: Secondary | ICD-10-CM | POA: Diagnosis not present

## 2017-08-26 DIAGNOSIS — B351 Tinea unguium: Secondary | ICD-10-CM | POA: Diagnosis not present

## 2017-08-26 DIAGNOSIS — Z794 Long term (current) use of insulin: Secondary | ICD-10-CM | POA: Diagnosis not present

## 2017-08-26 DIAGNOSIS — L851 Acquired keratosis [keratoderma] palmaris et plantaris: Secondary | ICD-10-CM | POA: Diagnosis not present

## 2017-09-06 ENCOUNTER — Ambulatory Visit: Payer: PPO | Admitting: Internal Medicine

## 2017-10-06 DIAGNOSIS — C44622 Squamous cell carcinoma of skin of right upper limb, including shoulder: Secondary | ICD-10-CM | POA: Diagnosis not present

## 2017-10-06 DIAGNOSIS — Z85828 Personal history of other malignant neoplasm of skin: Secondary | ICD-10-CM | POA: Diagnosis not present

## 2017-10-06 DIAGNOSIS — L820 Inflamed seborrheic keratosis: Secondary | ICD-10-CM | POA: Diagnosis not present

## 2017-10-06 DIAGNOSIS — L821 Other seborrheic keratosis: Secondary | ICD-10-CM | POA: Diagnosis not present

## 2017-10-06 DIAGNOSIS — D485 Neoplasm of uncertain behavior of skin: Secondary | ICD-10-CM | POA: Diagnosis not present

## 2017-10-06 DIAGNOSIS — L578 Other skin changes due to chronic exposure to nonionizing radiation: Secondary | ICD-10-CM | POA: Diagnosis not present

## 2017-10-06 DIAGNOSIS — L570 Actinic keratosis: Secondary | ICD-10-CM | POA: Diagnosis not present

## 2017-10-11 ENCOUNTER — Ambulatory Visit (INDEPENDENT_AMBULATORY_CARE_PROVIDER_SITE_OTHER): Payer: PPO | Admitting: Vascular Surgery

## 2017-10-11 ENCOUNTER — Encounter (INDEPENDENT_AMBULATORY_CARE_PROVIDER_SITE_OTHER): Payer: Self-pay | Admitting: Vascular Surgery

## 2017-10-11 VITALS — BP 188/76 | HR 69 | Resp 18 | Ht 73.0 in | Wt 223.0 lb

## 2017-10-11 DIAGNOSIS — I10 Essential (primary) hypertension: Secondary | ICD-10-CM

## 2017-10-11 DIAGNOSIS — I739 Peripheral vascular disease, unspecified: Secondary | ICD-10-CM

## 2017-10-11 DIAGNOSIS — I872 Venous insufficiency (chronic) (peripheral): Secondary | ICD-10-CM | POA: Diagnosis not present

## 2017-10-11 NOTE — Progress Notes (Signed)
MRN : [id number]  Justin Hancock is a 84 y.o. (09/23/1927) male who presents with chief complaint of  Chief Complaint  Patient presents with  . Follow-up    No studies, f/u  .  History of Present Illness: Patient returns today in follow up of lower extremity swelling and ulceration his legs are actually much better today.  He has stopped wearing compression stockings as the swelling has improved.  He has been elevating his legs and has been more he also had medication changes several months ago.   Current Outpatient Prescriptions  Medication Sig Dispense Refill  . clopidogrel (PLAVIX) 75 MG tablet Take 75 mg by mouth daily. am    . finasteride (PROSCAR) 5 MG tablet Take 1 tablet (5 mg total) by mouth daily. 90 tablet 4  . hydrALAZINE (APRESOLINE) 25 MG tablet Take 75 mg by mouth 3 (three) times daily. Am,lunch and pm    . hydrochlorothiazide (HYDRODIURIL) 12.5 MG tablet Take 12.5 mg by mouth daily. am    . lisinopril (PRINIVIL,ZESTRIL) 20 MG tablet TAKE ONE TABLET BY MOUTH TWICE A DAY    . loperamide (IMODIUM) 2 MG capsule Take 2 mg by mouth as needed for diarrhea or loose stools. Am and pm    . lovastatin (MEVACOR) 20 MG tablet Take 20 mg by mouth 2 (two) times daily. Am and bedtime    . metFORMIN (GLUCOPHAGE) 500 MG tablet Take 500 mg by mouth 4 (four) times daily. Am,lunch,dinner,pm    . metoprolol succinate (TOPROL-XL) 50 MG 24 hr tablet Take 50 mg by mouth 2 (two) times daily. Take with or immediately following a meal. /am and pm    . Multiple Vitamin (MULTIVITAMIN) tablet Take 1 tablet by mouth daily.    . Multiple Vitamins-Minerals (PRESERVISION AREDS 2+MULTI VIT PO) Take by mouth. Am and pm    . tamsulosin (FLOMAX) 0.4 MG CAPS capsule Take by mouth. am    . triamcinolone cream (KENALOG) 0.1 % Apply topically.    . vitamin C (ASCORBIC ACID) 500 MG tablet Take by mouth.    . furosemide (LASIX) 20 MG tablet Take 20 mg by mouth. am     No current  facility-administered medications for this visit.        Past Medical History:  Diagnosis Date  . Arthritis    handsd  . Balance problem    uses cane or walker  . BPH (benign prostatic hyperplasia)   . Cancer ([place])    colon  . Chronic kidney disease 2015   renal stentx1  . Coronary artery disease    multiple stentsx7  . Diabetes mellitus without complication ([place])   . Heart murmur   . Heartburn   . History of hiatal hernia   . HLD (hyperlipidemia)   . Hypertension   . Neuromuscular disorder ([facility])    neuropathy  . Peripheral vascular disease ([place])   . Sleep apnea    no CPAP  . Sleeps in sitting position due to orthopnea          Past Surgical History:  Procedure Laterality Date  . ANKLE FRACTURE SURGERY    . APPENDECTOMY    . CATARACT EXTRACTION W/PHACO Right 11/24/2016   Procedure: CATARACT EXTRACTION PHACO AND INTRAOCULAR LENS PLACEMENT (IOC) Right eye Diabetic toric lens; Surgeon: Leandrew Koyanagi, MD; Location: [facility]; Service: Ophthalmology; Laterality: Right; Diabetic - oral meds Toric lens malyugin  . CATARACT EXTRACTION W/PHACO Left 01/26/2017   Procedure: CATARACT EXTRACTION PHACO AND  INTRAOCULAR LENS PLACEMENT ([facility]) Left Complicated Daibetic toric lens; Surgeon: Leandrew Koyanagi, MD; Location: [facility]; Service: Ophthalmology; Laterality: Left; Diabetic-oral meds Malyugin Toric Lens  . CHOLECYSTECTOMY    . colon cancer surgery     x 2/ resection  . HERNIA REPAIR    . LEG SURGERY     fracture  . PERIPHERAL VASCULAR CATHETERIZATION N/A 11/27/2015   Procedure: Abdominal Aortogram w/Lower Extremity; Surgeon: Algernon Huxley, MD; Location: [facility]; Service: Cardiovascular; Laterality: N/A;  . PERIPHERAL VASCULAR CATHETERIZATION  11/27/2015   Procedure: Lower Extremity Intervention; Surgeon: Algernon Huxley, MD; Location: [facility]; Service:  Cardiovascular;;  . TONSILLECTOMY      Social History      Social History  Substance Use Topics  . Smoking status: Former Smoker    Quit date: 09/27/1953  . Smokeless tobacco: Never Used  . Alcohol use No  Widower  Family History      Family History  Problem Relation Age of Onset  . Kidney Stones Father   . Prostate cancer Neg Hx   . Kidney disease Neg Hx   . Bladder Cancer Neg Hx   No bleeding or clotting disorders       Allergies  Allergen Reactions  . Penicillins Hives    And fever     REVIEW OF SYSTEMS(Negative unless checked)  Constitutional: [] Weight loss[] Fever[] Chills Cardiac:[] Chest pain[] Chest pressure[x] Palpitations [] Shortness of breath when laying flat [] Shortness of breath at rest [] Shortness of breath with exertion. Vascular: [] Pain in legs with walking[] Pain in legsat rest[] Pain in legs when laying flat [] Claudication [] Pain in feet when walking [] Pain in feet at rest [] Pain in feet when laying flat [] History of DVT [] Phlebitis [x] Swelling in legs [x] Varicose veins [x] Non-healing ulcers Pulmonary: [] Uses home oxygen [] Productive cough[] Hemoptysis [] Wheeze [] COPD [] Asthma Neurologic: [] Dizziness [] Blackouts [] Seizures [] History of stroke [] History of TIA[] Aphasia [] Temporary blindness[] Dysphagia [] Weaknessor numbness in arms [] Weakness or numbnessin legs Musculoskeletal: [x] Arthritis [] Joint swelling [] Joint pain [] Low back pain Hematologic:[] Easy bruising[] Easy bleeding [] Hypercoagulable state [] Anemic  Gastrointestinal:[] Blood in stool[] Vomiting blood[] Gastroesophageal reflux/heartburn[] Abdominal pain Genitourinary: [x] Chronic kidney disease [] Difficulturination [] Frequenturination [] Burning with urination[] Hematuria Skin: [] Rashes [x] Ulcers [x] Wounds Psychological: [] History of anxiety[] History of major  depression.       Physical Examination  BP (!) 188/76 (BP Location: Right Arm, Patient Position: Sitting)   Pulse 69   Resp 18   Ht 6\' 1"  (1.854 m)   Wt 101.2 kg (223 lb)   BMI 29.42 kg/m  Gen:  WD/WN, NAD Head: [place]/AT, No temporalis wasting. Ear/Nose/Throat: Hearing grossly intact, nares w/o erythema or drainage, trachea midline Eyes: Conjunctiva clear. Sclera non-icteric Neck: Supple.  No JVD.  Pulmonary:  Good air movement, no use of accessory muscles.  Cardiac: Irregularly irregular Vascular:  Vessel Right Left  Radial Palpable Palpable                          PT  1+ palpable  1+ palpable  DP  1+ palpable  1+ palpable    Musculoskeletal: M/S 5/5 throughout.  No deformity or atrophy.  1+ bilateral lower extremity edema. Neurologic: Sensation grossly intact in extremities.  Symmetrical.  Speech is fluent.  Psychiatric: Judgment intact, Mood & affect appropriate for pt's clinical situation. Dermatologic: No ulcerations.  Mild to moderate stasis dermatitis changes      Labs No results found for this or any previous visit (from the past 2160 hour(s)).  Radiology No results found.   Assessment/Plan Essential hypertension blood pressure control important in  reducing the progression of atherosclerotic disease. On appropriate oral medications.   Hyperlipidemia lipid control important in reducing the progression of atherosclerotic disease. Continue statin therapy  RAS (renal artery stenosis) ([place]) Checked earlier this year and found to be reasonably stable.  Scheduled to be rechecked next summer.  Swelling of limb Continue leg elevation and the compression performed by the wound care center.  Peripheral vascular disease ([place]) Stable with good perfusion at last check.   Chronic venous insufficiency At current, his symptoms are well controlled with conservative therapy and he is not interested in any intervention.  This is certainly I will be  seeing him back in 4-6 months.  He also has a history of PAD and we will check his arterial perfusion at that time.    Leotis Pain, MD  10/11/2017 5:01 PM    This note was created with Dragon medical transcription system.  Any errors from dictation are purely unintentional

## 2017-10-11 NOTE — Assessment & Plan Note (Signed)
At current, his symptoms are well controlled with conservative therapy and he is not interested in any intervention.  This is certainly I will be seeing him back in 4-6 months.  He also has a history of PAD and we will check his arterial perfusion at that time.

## 2017-11-07 DIAGNOSIS — E782 Mixed hyperlipidemia: Secondary | ICD-10-CM | POA: Diagnosis not present

## 2017-11-07 DIAGNOSIS — I255 Ischemic cardiomyopathy: Secondary | ICD-10-CM | POA: Diagnosis not present

## 2017-11-07 DIAGNOSIS — I493 Ventricular premature depolarization: Secondary | ICD-10-CM | POA: Diagnosis not present

## 2017-11-07 DIAGNOSIS — I5022 Chronic systolic (congestive) heart failure: Secondary | ICD-10-CM | POA: Diagnosis not present

## 2017-11-07 DIAGNOSIS — R0609 Other forms of dyspnea: Secondary | ICD-10-CM | POA: Diagnosis not present

## 2017-11-07 DIAGNOSIS — I10 Essential (primary) hypertension: Secondary | ICD-10-CM | POA: Diagnosis not present

## 2017-11-07 DIAGNOSIS — I2510 Atherosclerotic heart disease of native coronary artery without angina pectoris: Secondary | ICD-10-CM | POA: Diagnosis not present

## 2017-11-07 DIAGNOSIS — G4730 Sleep apnea, unspecified: Secondary | ICD-10-CM | POA: Diagnosis not present

## 2017-11-07 DIAGNOSIS — I209 Angina pectoris, unspecified: Secondary | ICD-10-CM | POA: Diagnosis not present

## 2017-11-07 DIAGNOSIS — I208 Other forms of angina pectoris: Secondary | ICD-10-CM | POA: Diagnosis not present

## 2017-11-07 DIAGNOSIS — N182 Chronic kidney disease, stage 2 (mild): Secondary | ICD-10-CM | POA: Diagnosis not present

## 2017-11-07 DIAGNOSIS — E669 Obesity, unspecified: Secondary | ICD-10-CM | POA: Diagnosis not present

## 2017-11-23 DIAGNOSIS — B351 Tinea unguium: Secondary | ICD-10-CM | POA: Diagnosis not present

## 2017-11-23 DIAGNOSIS — E1140 Type 2 diabetes mellitus with diabetic neuropathy, unspecified: Secondary | ICD-10-CM | POA: Diagnosis not present

## 2017-11-23 DIAGNOSIS — Z794 Long term (current) use of insulin: Secondary | ICD-10-CM | POA: Diagnosis not present

## 2017-11-23 DIAGNOSIS — L851 Acquired keratosis [keratoderma] palmaris et plantaris: Secondary | ICD-10-CM | POA: Diagnosis not present

## 2017-12-21 DIAGNOSIS — L570 Actinic keratosis: Secondary | ICD-10-CM | POA: Diagnosis not present

## 2017-12-21 DIAGNOSIS — L578 Other skin changes due to chronic exposure to nonionizing radiation: Secondary | ICD-10-CM | POA: Diagnosis not present

## 2017-12-21 DIAGNOSIS — D044 Carcinoma in situ of skin of scalp and neck: Secondary | ICD-10-CM | POA: Diagnosis not present

## 2017-12-21 DIAGNOSIS — D485 Neoplasm of uncertain behavior of skin: Secondary | ICD-10-CM | POA: Diagnosis not present

## 2017-12-21 DIAGNOSIS — Z85828 Personal history of other malignant neoplasm of skin: Secondary | ICD-10-CM | POA: Diagnosis not present

## 2018-01-10 DIAGNOSIS — L821 Other seborrheic keratosis: Secondary | ICD-10-CM | POA: Diagnosis not present

## 2018-01-10 DIAGNOSIS — L720 Epidermal cyst: Secondary | ICD-10-CM | POA: Diagnosis not present

## 2018-01-10 DIAGNOSIS — D044 Carcinoma in situ of skin of scalp and neck: Secondary | ICD-10-CM | POA: Diagnosis not present

## 2018-01-10 DIAGNOSIS — D692 Other nonthrombocytopenic purpura: Secondary | ICD-10-CM | POA: Diagnosis not present

## 2018-01-26 DIAGNOSIS — K529 Noninfective gastroenteritis and colitis, unspecified: Secondary | ICD-10-CM | POA: Diagnosis not present

## 2018-01-26 DIAGNOSIS — E119 Type 2 diabetes mellitus without complications: Secondary | ICD-10-CM | POA: Diagnosis not present

## 2018-01-26 DIAGNOSIS — E162 Hypoglycemia, unspecified: Secondary | ICD-10-CM | POA: Diagnosis not present

## 2018-01-26 DIAGNOSIS — I10 Essential (primary) hypertension: Secondary | ICD-10-CM | POA: Diagnosis not present

## 2018-01-30 DIAGNOSIS — Z85828 Personal history of other malignant neoplasm of skin: Secondary | ICD-10-CM | POA: Diagnosis not present

## 2018-01-30 DIAGNOSIS — D485 Neoplasm of uncertain behavior of skin: Secondary | ICD-10-CM | POA: Diagnosis not present

## 2018-01-30 DIAGNOSIS — L03115 Cellulitis of right lower limb: Secondary | ICD-10-CM | POA: Diagnosis not present

## 2018-01-30 DIAGNOSIS — D1809 Hemangioma of other sites: Secondary | ICD-10-CM | POA: Diagnosis not present

## 2018-01-30 DIAGNOSIS — L309 Dermatitis, unspecified: Secondary | ICD-10-CM | POA: Diagnosis not present

## 2018-02-06 DIAGNOSIS — L309 Dermatitis, unspecified: Secondary | ICD-10-CM | POA: Diagnosis not present

## 2018-02-06 DIAGNOSIS — L570 Actinic keratosis: Secondary | ICD-10-CM | POA: Diagnosis not present

## 2018-02-06 DIAGNOSIS — L578 Other skin changes due to chronic exposure to nonionizing radiation: Secondary | ICD-10-CM | POA: Diagnosis not present

## 2018-02-06 DIAGNOSIS — D1809 Hemangioma of other sites: Secondary | ICD-10-CM | POA: Diagnosis not present

## 2018-02-06 DIAGNOSIS — D485 Neoplasm of uncertain behavior of skin: Secondary | ICD-10-CM | POA: Diagnosis not present

## 2018-02-06 DIAGNOSIS — D692 Other nonthrombocytopenic purpura: Secondary | ICD-10-CM | POA: Diagnosis not present

## 2018-02-07 ENCOUNTER — Encounter (INDEPENDENT_AMBULATORY_CARE_PROVIDER_SITE_OTHER): Payer: PPO

## 2018-02-07 ENCOUNTER — Ambulatory Visit (INDEPENDENT_AMBULATORY_CARE_PROVIDER_SITE_OTHER): Payer: PPO | Admitting: Vascular Surgery

## 2018-02-13 ENCOUNTER — Encounter (INDEPENDENT_AMBULATORY_CARE_PROVIDER_SITE_OTHER): Payer: Self-pay | Admitting: Vascular Surgery

## 2018-03-09 DIAGNOSIS — H353132 Nonexudative age-related macular degeneration, bilateral, intermediate dry stage: Secondary | ICD-10-CM | POA: Diagnosis not present

## 2018-03-14 DIAGNOSIS — L851 Acquired keratosis [keratoderma] palmaris et plantaris: Secondary | ICD-10-CM | POA: Diagnosis not present

## 2018-03-14 DIAGNOSIS — Z794 Long term (current) use of insulin: Secondary | ICD-10-CM | POA: Diagnosis not present

## 2018-03-14 DIAGNOSIS — E1140 Type 2 diabetes mellitus with diabetic neuropathy, unspecified: Secondary | ICD-10-CM | POA: Diagnosis not present

## 2018-03-14 DIAGNOSIS — B351 Tinea unguium: Secondary | ICD-10-CM | POA: Diagnosis not present

## 2018-03-21 DIAGNOSIS — N401 Enlarged prostate with lower urinary tract symptoms: Secondary | ICD-10-CM | POA: Diagnosis not present

## 2018-03-21 DIAGNOSIS — E119 Type 2 diabetes mellitus without complications: Secondary | ICD-10-CM | POA: Diagnosis not present

## 2018-03-21 DIAGNOSIS — E785 Hyperlipidemia, unspecified: Secondary | ICD-10-CM | POA: Diagnosis not present

## 2018-03-21 DIAGNOSIS — R3911 Hesitancy of micturition: Secondary | ICD-10-CM | POA: Diagnosis not present

## 2018-03-21 DIAGNOSIS — I10 Essential (primary) hypertension: Secondary | ICD-10-CM | POA: Diagnosis not present

## 2018-04-11 ENCOUNTER — Encounter (INDEPENDENT_AMBULATORY_CARE_PROVIDER_SITE_OTHER): Payer: PPO

## 2018-04-11 ENCOUNTER — Ambulatory Visit (INDEPENDENT_AMBULATORY_CARE_PROVIDER_SITE_OTHER): Payer: PPO | Admitting: Vascular Surgery
